# Patient Record
Sex: Male | Born: 1955 | Race: White | Hispanic: No | Marital: Married | Smoking: Never smoker
Health system: Southern US, Community
[De-identification: ages and names within clinical notes are randomized; demographics above are authoritative.]

## PROBLEM LIST (undated history)

## (undated) DIAGNOSIS — E785 Hyperlipidemia, unspecified: Secondary | ICD-10-CM

## (undated) DIAGNOSIS — I10 Essential (primary) hypertension: Secondary | ICD-10-CM

## (undated) DIAGNOSIS — M722 Plantar fascial fibromatosis: Secondary | ICD-10-CM

## (undated) HISTORY — PX: TONSILLECTOMY: SUR1361

## (undated) HISTORY — DX: Hyperlipidemia, unspecified: E78.5

## (undated) HISTORY — DX: Essential (primary) hypertension: I10

## (undated) HISTORY — PX: COLONOSCOPY: SHX174

## (undated) HISTORY — DX: Plantar fascial fibromatosis: M72.2

---

## 1976-02-23 DIAGNOSIS — M722 Plantar fascial fibromatosis: Secondary | ICD-10-CM

## 1976-02-23 HISTORY — DX: Plantar fascial fibromatosis: M72.2

## 1976-02-23 HISTORY — PX: HEEL SPUR SURGERY: SHX665

## 2008-11-12 ENCOUNTER — Encounter: Admission: RE | Admit: 2008-11-12 | Discharge: 2008-11-12 | Payer: Self-pay | Admitting: Orthopedic Surgery

## 2010-02-22 HISTORY — PX: SHOULDER SURGERY: SHX246

## 2011-04-28 ENCOUNTER — Other Ambulatory Visit (HOSPITAL_COMMUNITY)
Admission: RE | Admit: 2011-04-28 | Discharge: 2011-04-28 | Disposition: A | Discharge status: Home / Self Care | Payer: BC Managed Care – PPO | Source: Ambulatory Visit | Attending: Otolaryngology | Admitting: Otolaryngology

## 2011-04-28 DIAGNOSIS — R220 Localized swelling, mass and lump, head: Secondary | ICD-10-CM | POA: Insufficient documentation

## 2011-04-28 HISTORY — PX: US ECHOCARDIOGRAPHY: HXRAD669

## 2012-05-28 ENCOUNTER — Encounter: Payer: Self-pay | Admitting: *Deleted

## 2012-05-30 ENCOUNTER — Encounter: Payer: Self-pay | Admitting: Cardiovascular Disease

## 2012-11-21 ENCOUNTER — Other Ambulatory Visit: Payer: Self-pay | Admitting: Orthopedic Surgery

## 2012-12-14 ENCOUNTER — Encounter (HOSPITAL_BASED_OUTPATIENT_CLINIC_OR_DEPARTMENT_OTHER): Payer: Self-pay | Admitting: *Deleted

## 2012-12-14 ENCOUNTER — Encounter (HOSPITAL_BASED_OUTPATIENT_CLINIC_OR_DEPARTMENT_OTHER)
Admission: RE | Admit: 2012-12-14 | Discharge: 2012-12-14 | Disposition: A | Discharge status: Home / Self Care | Payer: BC Managed Care – PPO | Source: Ambulatory Visit | Attending: Orthopedic Surgery | Admitting: Orthopedic Surgery

## 2012-12-14 DIAGNOSIS — Z79899 Other long term (current) drug therapy: Secondary | ICD-10-CM | POA: Diagnosis not present

## 2012-12-14 DIAGNOSIS — Z01812 Encounter for preprocedural laboratory examination: Secondary | ICD-10-CM | POA: Diagnosis not present

## 2012-12-14 DIAGNOSIS — E785 Hyperlipidemia, unspecified: Secondary | ICD-10-CM | POA: Diagnosis not present

## 2012-12-14 DIAGNOSIS — I10 Essential (primary) hypertension: Secondary | ICD-10-CM | POA: Diagnosis not present

## 2012-12-14 DIAGNOSIS — M25819 Other specified joint disorders, unspecified shoulder: Secondary | ICD-10-CM | POA: Diagnosis present

## 2012-12-14 DIAGNOSIS — M24119 Other articular cartilage disorders, unspecified shoulder: Secondary | ICD-10-CM | POA: Diagnosis not present

## 2012-12-14 DIAGNOSIS — Z7982 Long term (current) use of aspirin: Secondary | ICD-10-CM | POA: Diagnosis not present

## 2012-12-14 DIAGNOSIS — M67919 Unspecified disorder of synovium and tendon, unspecified shoulder: Secondary | ICD-10-CM | POA: Diagnosis not present

## 2012-12-14 LAB — BASIC METABOLIC PANEL
GFR calc Af Amer: 90 mL/min — ABNORMAL LOW (ref >90)
GFR calc non Af Amer: 78 mL/min — ABNORMAL LOW (ref >90)
Glucose, Bld: 96 mg/dL (ref 70–99)
Potassium: 4.7 mEq/L (ref 3.5–5.1)
Sodium: 137 mEq/L (ref 135–145)

## 2012-12-14 NOTE — Progress Notes (Signed)
Will come in for bmet-very health-sees dr croitoru for htn-works out gym 3xweek-never smoked

## 2012-12-18 ENCOUNTER — Encounter (HOSPITAL_BASED_OUTPATIENT_CLINIC_OR_DEPARTMENT_OTHER)
Admission: RE | Disposition: A | Discharge status: Home / Self Care | Payer: Self-pay | Source: Ambulatory Visit | Attending: Orthopedic Surgery

## 2012-12-18 ENCOUNTER — Encounter (HOSPITAL_BASED_OUTPATIENT_CLINIC_OR_DEPARTMENT_OTHER): Payer: BC Managed Care – PPO | Admitting: Anesthesiology

## 2012-12-18 ENCOUNTER — Ambulatory Visit (HOSPITAL_BASED_OUTPATIENT_CLINIC_OR_DEPARTMENT_OTHER)
Admission: RE | Admit: 2012-12-18 | Discharge: 2012-12-18 | Disposition: A | Discharge status: Home / Self Care | Payer: BC Managed Care – PPO | Source: Ambulatory Visit | Attending: Orthopedic Surgery | Admitting: Orthopedic Surgery

## 2012-12-18 ENCOUNTER — Encounter (HOSPITAL_BASED_OUTPATIENT_CLINIC_OR_DEPARTMENT_OTHER): Payer: Self-pay

## 2012-12-18 ENCOUNTER — Ambulatory Visit (HOSPITAL_BASED_OUTPATIENT_CLINIC_OR_DEPARTMENT_OTHER): Payer: BC Managed Care – PPO | Admitting: Anesthesiology

## 2012-12-18 DIAGNOSIS — M67919 Unspecified disorder of synovium and tendon, unspecified shoulder: Secondary | ICD-10-CM | POA: Insufficient documentation

## 2012-12-18 DIAGNOSIS — Z79899 Other long term (current) drug therapy: Secondary | ICD-10-CM | POA: Insufficient documentation

## 2012-12-18 DIAGNOSIS — M25819 Other specified joint disorders, unspecified shoulder: Secondary | ICD-10-CM | POA: Insufficient documentation

## 2012-12-18 DIAGNOSIS — I10 Essential (primary) hypertension: Secondary | ICD-10-CM | POA: Insufficient documentation

## 2012-12-18 DIAGNOSIS — S43432D Superior glenoid labrum lesion of left shoulder, subsequent encounter: Secondary | ICD-10-CM

## 2012-12-18 DIAGNOSIS — E785 Hyperlipidemia, unspecified: Secondary | ICD-10-CM | POA: Insufficient documentation

## 2012-12-18 DIAGNOSIS — Z7982 Long term (current) use of aspirin: Secondary | ICD-10-CM | POA: Insufficient documentation

## 2012-12-18 DIAGNOSIS — M719 Bursopathy, unspecified: Secondary | ICD-10-CM | POA: Insufficient documentation

## 2012-12-18 DIAGNOSIS — M24119 Other articular cartilage disorders, unspecified shoulder: Secondary | ICD-10-CM | POA: Insufficient documentation

## 2012-12-18 DIAGNOSIS — Z01812 Encounter for preprocedural laboratory examination: Secondary | ICD-10-CM | POA: Insufficient documentation

## 2012-12-18 HISTORY — PX: SHOULDER ARTHROSCOPY WITH BICEPSTENOTOMY: SHX6204

## 2012-12-18 LAB — POCT HEMOGLOBIN-HEMACUE: Hemoglobin: 16.8 g/dL (ref 13.0–17.0)

## 2012-12-18 SURGERY — SHOULDER ARTHROSCOPY WITH BICEPS TENOTOMY
Anesthesia: General | Site: Shoulder | Laterality: Left | Wound class: Clean

## 2012-12-18 MED ORDER — OXYCODONE-ACETAMINOPHEN 5-325 MG PO TABS: 1.0000 | ORAL_TABLET | ORAL | Status: DC | PRN

## 2012-12-18 MED ORDER — PROPOFOL 10 MG/ML IV BOLUS
INTRAVENOUS | Status: DC | PRN
  Administered 2012-12-18: 200 mg via INTRAVENOUS

## 2012-12-18 MED ORDER — EPHEDRINE SULFATE 50 MG/ML IJ SOLN
INTRAMUSCULAR | Status: DC | PRN
  Administered 2012-12-18: 10 mg via INTRAVENOUS
  Administered 2012-12-18: 5 mg via INTRAVENOUS

## 2012-12-18 MED ORDER — LIDOCAINE HCL (CARDIAC) 20 MG/ML IV SOLN
INTRAVENOUS | Status: DC | PRN
  Administered 2012-12-18: 75 mg via INTRAVENOUS

## 2012-12-18 MED ORDER — FENTANYL CITRATE 0.05 MG/ML IJ SOLN
INTRAMUSCULAR | Status: AC
  Filled 2012-12-18: qty 2

## 2012-12-18 MED ORDER — DEXAMETHASONE SODIUM PHOSPHATE 4 MG/ML IJ SOLN
INTRAMUSCULAR | Status: DC | PRN
  Administered 2012-12-18: 4 mg

## 2012-12-18 MED ORDER — ONDANSETRON HCL 4 MG/2ML IJ SOLN: 4.0000 mg | Freq: Once | INTRAMUSCULAR | Status: DC | PRN

## 2012-12-18 MED ORDER — MIDAZOLAM HCL 2 MG/2ML IJ SOLN
1.0000 mg | INTRAMUSCULAR | Status: DC | PRN
  Administered 2012-12-18: 2 mg via INTRAVENOUS

## 2012-12-18 MED ORDER — SUCCINYLCHOLINE CHLORIDE 20 MG/ML IJ SOLN
INTRAMUSCULAR | Status: DC | PRN
  Administered 2012-12-18: 100 mg via INTRAVENOUS

## 2012-12-18 MED ORDER — ONDANSETRON HCL 4 MG/2ML IJ SOLN
INTRAMUSCULAR | Status: DC | PRN
  Administered 2012-12-18: 4 mg via INTRAVENOUS

## 2012-12-18 MED ORDER — BUPIVACAINE-EPINEPHRINE PF 0.5-1:200000 % IJ SOLN
INTRAMUSCULAR | Status: DC | PRN
  Administered 2012-12-18: 22 mL

## 2012-12-18 MED ORDER — FENTANYL CITRATE 0.05 MG/ML IJ SOLN
INTRAMUSCULAR | Status: AC
  Filled 2012-12-18: qty 4

## 2012-12-18 MED ORDER — CEFAZOLIN SODIUM-DEXTROSE 2-3 GM-% IV SOLR
INTRAVENOUS | Status: AC
  Filled 2012-12-18: qty 50

## 2012-12-18 MED ORDER — OXYCODONE HCL 5 MG PO TABS: 5.0000 mg | ORAL_TABLET | Freq: Once | ORAL | Status: DC | PRN

## 2012-12-18 MED ORDER — HYDROMORPHONE HCL PF 1 MG/ML IJ SOLN: 0.2500 mg | INTRAMUSCULAR | Status: DC | PRN

## 2012-12-18 MED ORDER — SODIUM CHLORIDE 0.9 % IR SOLN
Status: DC | PRN
  Administered 2012-12-18: 4500 mL

## 2012-12-18 MED ORDER — LACTATED RINGERS IV SOLN
INTRAVENOUS | Status: DC
  Administered 2012-12-18 (×2): via INTRAVENOUS

## 2012-12-18 MED ORDER — POVIDONE-IODINE 7.5 % EX SOLN: Freq: Once | CUTANEOUS | Status: DC

## 2012-12-18 MED ORDER — OXYCODONE HCL 5 MG/5ML PO SOLN: 5.0000 mg | Freq: Once | ORAL | Status: DC | PRN

## 2012-12-18 MED ORDER — MIDAZOLAM HCL 2 MG/2ML IJ SOLN
INTRAMUSCULAR | Status: AC
  Filled 2012-12-18: qty 2

## 2012-12-18 MED ORDER — DOCUSATE SODIUM 100 MG PO CAPS: 100.0000 mg | ORAL_CAPSULE | Freq: Three times a day (TID) | ORAL | Status: DC | PRN

## 2012-12-18 MED ORDER — CEFAZOLIN SODIUM-DEXTROSE 2-3 GM-% IV SOLR
2.0000 g | INTRAVENOUS | Status: AC
  Administered 2012-12-18: 2 g via INTRAVENOUS

## 2012-12-18 MED ORDER — FENTANYL CITRATE 0.05 MG/ML IJ SOLN
50.0000 ug | INTRAMUSCULAR | Status: DC | PRN
  Administered 2012-12-18: 100 ug via INTRAVENOUS

## 2012-12-18 SURGICAL SUPPLY — 84 items
BENZOIN TINCTURE PRP APPL 2/3 (GAUZE/BANDAGES/DRESSINGS) ×2 IMPLANT
BIT DRILL 3/32DIAX5INL DISPOSE (BIT) ×1 IMPLANT
BIT DRILL 3/32DX5IN DISP (BIT) ×1
BIT DRILL 7/64X5 DISP (BIT) ×2 IMPLANT
BLADE SURG 15 STRL LF DISP TIS (BLADE) ×1 IMPLANT
BLADE SURG 15 STRL SS (BLADE) ×1
BLADE SURG ROTATE 9660 (MISCELLANEOUS) IMPLANT
BLADE VORTEX 6.0 (BLADE) IMPLANT
BUR OVAL 4.0 (BURR) ×2 IMPLANT
CANISTER OMNI JUG 16 LITER (MISCELLANEOUS) ×2 IMPLANT
CANISTER SUCT 3000ML (MISCELLANEOUS) IMPLANT
CANNULA 5.75X71 LONG (CANNULA) ×2 IMPLANT
CANNULA TWIST IN 8.25X7CM (CANNULA) IMPLANT
CHLORAPREP W/TINT 26ML (MISCELLANEOUS) ×2 IMPLANT
DECANTER SPIKE VIAL GLASS SM (MISCELLANEOUS) IMPLANT
DERMABOND ADVANCED (GAUZE/BANDAGES/DRESSINGS) ×1
DERMABOND ADVANCED .7 DNX12 (GAUZE/BANDAGES/DRESSINGS) ×1 IMPLANT
DRAPE INCISE IOBAN 66X45 STRL (DRAPES) ×2 IMPLANT
DRAPE STERI 35X30 U-POUCH (DRAPES) ×2 IMPLANT
DRAPE SURG 17X23 STRL (DRAPES) ×2 IMPLANT
DRAPE U 20/CS (DRAPES) ×2 IMPLANT
DRAPE U-SHAPE 47X51 STRL (DRAPES) ×2 IMPLANT
DRAPE U-SHAPE 76X120 STRL (DRAPES) ×4 IMPLANT
DRILL BIT 3/32DIAX5INL DISPOSE (BIT) ×1
DRSG PAD ABDOMINAL 8X10 ST (GAUZE/BANDAGES/DRESSINGS) ×2 IMPLANT
ELECT BLADE 4.0 EZ CLEAN MEGAD (MISCELLANEOUS)
ELECT REM PT RETURN 9FT ADLT (ELECTROSURGICAL) ×2
ELECTRODE BLDE 4.0 EZ CLN MEGD (MISCELLANEOUS) IMPLANT
ELECTRODE REM PT RTRN 9FT ADLT (ELECTROSURGICAL) ×1 IMPLANT
GAUZE SPONGE 4X4 16PLY XRAY LF (GAUZE/BANDAGES/DRESSINGS) IMPLANT
GAUZE XEROFORM 1X8 LF (GAUZE/BANDAGES/DRESSINGS) IMPLANT
GLOVE BIO SURGEON STRL SZ7 (GLOVE) ×2 IMPLANT
GLOVE BIO SURGEON STRL SZ7.5 (GLOVE) ×2 IMPLANT
GLOVE BIOGEL PI IND STRL 7.0 (GLOVE) ×3 IMPLANT
GLOVE BIOGEL PI IND STRL 8 (GLOVE) ×1 IMPLANT
GLOVE BIOGEL PI INDICATOR 7.0 (GLOVE) ×3
GLOVE BIOGEL PI INDICATOR 8 (GLOVE) ×1
GLOVE ECLIPSE 6.5 STRL STRAW (GLOVE) ×2 IMPLANT
GOWN BRE IMP PREV XXLGXLNG (GOWN DISPOSABLE) ×2 IMPLANT
GOWN PREVENTION PLUS XLARGE (GOWN DISPOSABLE) ×4 IMPLANT
LASSO CRESCENT QUICKPASS (SUTURE) ×2 IMPLANT
NDL SUT 6 .5 CRC .975X.05 MAYO (NEEDLE) IMPLANT
NEEDLE 1/2 CIR CATGUT .05X1.09 (NEEDLE) IMPLANT
NEEDLE MAYO TAPER (NEEDLE)
NEEDLE SCORPION MULTI FIRE (NEEDLE) IMPLANT
NS IRRIG 1000ML POUR BTL (IV SOLUTION) IMPLANT
PACK ARTHROSCOPY DSU (CUSTOM PROCEDURE TRAY) ×2 IMPLANT
PACK BASIN DAY SURGERY FS (CUSTOM PROCEDURE TRAY) ×2 IMPLANT
PENCIL BUTTON HOLSTER BLD 10FT (ELECTRODE) ×2 IMPLANT
RESECTOR FULL RADIUS 4.2MM (BLADE) ×2 IMPLANT
SLEEVE SCD COMPRESS KNEE MED (MISCELLANEOUS) ×2 IMPLANT
SLING ARM FOAM STRAP LRG (SOFTGOODS) ×2 IMPLANT
SLING ARM FOAM STRAP MED (SOFTGOODS) IMPLANT
SLING ARM FOAM STRAP XLG (SOFTGOODS) IMPLANT
SLING ARM IMMOBILIZER MED (SOFTGOODS) IMPLANT
SPONGE GAUZE 4X4 12PLY (GAUZE/BANDAGES/DRESSINGS) ×2 IMPLANT
SPONGE LAP 4X18 X RAY DECT (DISPOSABLE) ×2 IMPLANT
STRIP CLOSURE SKIN 1/2X4 (GAUZE/BANDAGES/DRESSINGS) ×2 IMPLANT
SUCTION FRAZIER TIP 10 FR DISP (SUCTIONS) ×2 IMPLANT
SUPPORT WRAP ARM LG (MISCELLANEOUS) ×2 IMPLANT
SUT 2 FIBERLOOP 20 STRT BLUE (SUTURE) ×2
SUT BONE WAX W31G (SUTURE) IMPLANT
SUT ETHIBOND 2 OS 4 DA (SUTURE) IMPLANT
SUT ETHILON 3 0 PS 1 (SUTURE) ×2 IMPLANT
SUT ETHILON 4 0 PS 2 18 (SUTURE) IMPLANT
SUT FIBERWIRE #2 38 T-5 BLUE (SUTURE)
SUT MNCRL AB 3-0 PS2 18 (SUTURE) IMPLANT
SUT MNCRL AB 4-0 PS2 18 (SUTURE) IMPLANT
SUT PDS AB 0 CT 36 (SUTURE) IMPLANT
SUT PROLENE 3 0 PS 2 (SUTURE) IMPLANT
SUT VIC AB 0 CT1 27 (SUTURE) ×1
SUT VIC AB 0 CT1 27XBRD ANBCTR (SUTURE) ×1 IMPLANT
SUT VIC AB 2-0 SH 27 (SUTURE) ×1
SUT VIC AB 2-0 SH 27XBRD (SUTURE) ×1 IMPLANT
SUTURE 2 FIBERLOOP 20 STRT BLU (SUTURE) ×1 IMPLANT
SUTURE FIBERWR #2 38 T-5 BLUE (SUTURE) IMPLANT
SYR BULB 3OZ (MISCELLANEOUS) IMPLANT
TOWEL OR 17X24 6PK STRL BLUE (TOWEL DISPOSABLE) ×2 IMPLANT
TOWEL OR NON WOVEN STRL DISP B (DISPOSABLE) ×2 IMPLANT
TUBE CONNECTING 20X1/4 (TUBING) ×2 IMPLANT
TUBING ARTHROSCOPY IRRIG 16FT (MISCELLANEOUS) ×2 IMPLANT
WAND STAR VAC 90 (SURGICAL WAND) ×2 IMPLANT
WATER STERILE IRR 1000ML POUR (IV SOLUTION) ×2 IMPLANT
YANKAUER SUCT BULB TIP NO VENT (SUCTIONS) ×2 IMPLANT

## 2012-12-18 NOTE — Anesthesia Procedure Notes (Addendum)
Anesthesia Regional Block:  Interscalene brachial plexus block  Pre-Anesthetic Checklist: ,, timeout performed, Correct Patient, Correct Site, Correct Laterality, Correct Procedure, Correct Position, site marked, Risks and benefits discussed,  Surgical consent,  Pre-op evaluation,  At surgeon's request and post-op pain management  Laterality: Left and Upper  Prep: chloraprep       Needles:  Injection technique: Single-shot  Needle Type: Echogenic Needle     Needle Length: 5cm 5 cm Needle Gauge: 21 and 21 G    Additional Needles:  Procedures: ultrasound guided (picture in chart) Interscalene brachial plexus block Narrative:  Start time: 12/18/2012 10:29 AM End time: 12/18/2012 10:28 AM Injection made incrementally with aspirations every 5 mL.  Performed by: Personally  Anesthesiologist: Sheldon Silvan, MD  Interscalene brachial plexus block Procedure Name: Intubation Performed by: Lance Coon Pre-anesthesia Checklist: Patient identified, Emergency Drugs available, Suction available and Patient being monitored Patient Re-evaluated:Patient Re-evaluated prior to inductionOxygen Delivery Method: Circle System Utilized Preoxygenation: Pre-oxygenation with 100% oxygen Intubation Type: IV induction Ventilation: Mask ventilation without difficulty Laryngoscope Size: Mac and 3 Grade View: Grade II Tube type: Oral Tube size: 7.0 mm Number of attempts: 1 Airway Equipment and Method: stylet and oral airway Placement Confirmation: ETT inserted through vocal cords under direct vision,  positive ETCO2 and breath sounds checked- equal and bilateral Tube secured with: Tape Dental Injury: Teeth and Oropharynx as per pre-operative assessment

## 2012-12-18 NOTE — Anesthesia Preprocedure Evaluation (Signed)
Anesthesia Evaluation  Patient identified by MRN, date of birth, ID band Patient awake    Reviewed: Allergy & Precautions, H&P , NPO status , Patient's Chart, lab work & pertinent test results  Airway Mallampati: I TM Distance: >3 FB Neck ROM: Full    Dental  (+) Teeth Intact and Dental Advisory Given   Pulmonary  breath sounds clear to auscultation        Cardiovascular hypertension, Pt. on medications Rhythm:Regular Rate:Normal     Neuro/Psych    GI/Hepatic   Endo/Other    Renal/GU      Musculoskeletal   Abdominal   Peds  Hematology   Anesthesia Other Findings   Reproductive/Obstetrics                           Anesthesia Physical Anesthesia Plan  ASA: II  Anesthesia Plan: General   Post-op Pain Management:    Induction: Intravenous  Airway Management Planned: Oral ETT  Additional Equipment:   Intra-op Plan:   Post-operative Plan: Extubation in OR  Informed Consent: I have reviewed the patients History and Physical, chart, labs and discussed the procedure including the risks, benefits and alternatives for the proposed anesthesia with the patient or authorized representative who has indicated his/her understanding and acceptance.   Dental advisory given  Plan Discussed with: CRNA, Anesthesiologist and Surgeon  Anesthesia Plan Comments:         Anesthesia Quick Evaluation  

## 2012-12-18 NOTE — Progress Notes (Signed)
  Assisted Dr. Crews with left, ultrasound guided, supraclavicular block. Side rails up, monitors on throughout procedure. See vital signs in flow sheet. Tolerated Procedure well. 

## 2012-12-18 NOTE — Transfer of Care (Signed)
Immediate Anesthesia Transfer of Care Note  Patient: Rodney Thompson  Procedure(s) Performed: Procedure(s): SHOULDER ARTHROSCOPY, SUBACROMIAL DECOMPRESSION, OPEN BICEPS TENOTOMY (Left)  Patient Location: PACU  Anesthesia Type:GA combined with regional for post-op pain  Level of Consciousness: awake and alert   Airway & Oxygen Therapy: Patient Spontanous Breathing and Patient connected to face mask oxygen  Post-op Assessment: Report given to PACU RN and Post -op Vital signs reviewed and stable  Post vital signs: Reviewed and stable  Complications: No apparent anesthesia complications

## 2012-12-18 NOTE — Op Note (Signed)
Procedure(s): SHOULDER ARTHROSCOPY, SUBACROMIAL DECOMPRESSION, OPEN BICEPS TENOTOMY Procedure Note  Rodney Thompson male 57 y.o. 12/18/2012  Procedure(s) and Anesthesia Type:   #1 left shoulder arthroscopic debridement of type II superior labral tear with biceps tenotomy and debridement partial-thickness articular sided supraspinatus tear #2 left shoulder arthroscopic subacromial decompression #3 left shoulder open subpectoral biceps tenodesis  Surgeon(s) and Role:    * Mable Paris, MD - Primary     Surgeon: Mable Paris   Assistants: Damita Lack PA-C [facility] was present and scrubbed throughout the procedure and was essential in positioning, assisting with the camera and instrumentation,, and closure)  Anesthesia: General endotracheal anesthesia    Procedure Detail  SHOULDER ARTHROSCOPY, SUBACROMIAL DECOMPRESSION, OPEN BICEPS TENOTOMY  Estimated Blood Loss: Min         Drains: none  Blood Given: none         Specimens: none        Complications:  * No complications entered in OR log *         Disposition: PACU - hemodynamically stable.         Condition: stable    Procedure:   INDICATIONS FOR SURGERY: The patient is 57 y.o. male who has had a long history of left shoulder pain which has been refractory to nonoperative management with activity modification, exercise is an injection therapy. He mainly had anterior rotator cuff pain with biceps tendon pain.  OPERATIVE FINDINGS: Examination under anesthesia: No stiffness or instability Diagnostic Arthroscopy:  Glenoid articular cartilage: Intact Humeral head articular cartilage: Intact Labrum: Extensive partial tearing of the anterior labrum with complete detachment of the superior labrum from the supraglenoid tubercle. Complete detachment of the biceps anchor. The biceps tendon itself was intact. Loose bodies: None Synovitis: Minimal Articular sided rotator cuff: Partial-thickness  tearing involving less than 10% of the complete thickness of the supraspinatus tendon, debrided. Bursal sided rotator cuff: Intact Coracoacromial ligament: Mildly thickened and frayed indicating chronic impingement.  DESCRIPTION OF PROCEDURE: The patient was identified in preoperative  holding area where I personally marked the operative site after  verifying site, side, and procedure with the patient. An interscalene block was given by the attending anesthesiologist the holding area.  The patient was taken back to the operating room where general anesthesia was induced without complication and was placed in the beach-chair position with the back  elevated about 60 degrees and all extremities and head and neck carefully padded and  positioned.   The left upper extremity was then prepped and  draped in a standard sterile fashion. The appropriate time-out  procedure was carried out. The patient did receive IV antibiotics  within 30 minutes of incision.   A small posterior portal incision was made and the arthroscope was introduced into the joint. An anterior portal was then established above the subscapularis using needle localization. Small cannula was placed anteriorly. Diagnostic arthroscopy was then carried out with findings as described above.  The predominant finding was extensive partial tearing of the anterior labrum which was debrided back to healthy attached labrum. Superiorly the entire biceps anchor, superior labrum was detached from the supraglenoid tubercle. This area was debrided. Given his preoperative symptoms and finding of complete detachment of the biceps anchor a biceps tenotomy was performed. The stump was debrided. Attention was turned to the articular sided rotator cuff which was intact posteriorly. Superiorly he had extensive partial tearing involving the entire supraspinatus. This was debrided back to healthy bleeding tendon. There was not any significant exposed  footprint  of the greater tuberosity. Glenohumeral joint surfaces were intact.  The arthroscope was then introduced into the subacromial space a standard lateral portal was established with needle localization. The shaver was used through the lateral portal to perform extensive bursectomy. Coracoacromial ligament was examined and found to be slightly thickened and frayed.  The bursal surface of the rotator cuff was carefully examined posterior and anterior and found to have no partial or full-thickness tearing  The coracoacromial ligament was taken down off the anterior acromion with the ArthroCare exposing a moderate anterior acromial spur. A high-speed bur was then used through the lateral portal to take down the anterior acromial spur from lateral to medial in a standard acromioplasty.  The acromioplasty was also viewed from the lateral portal and the bur was used as necessary to ensure that the acromion was completely flat from posterior to anterior.   Attention was then turned to the axilla where a approximately 3 cm incision was made in the dominant axillary fold. This was about 50% above and 50% below the palpable lower border of the pectoralis major. Dissection was carried out between the lower border of the pectoralis major and the short head of the biceps muscle belly. The anterior humerus was then exposed and the long head biceps was delivered out through the wound. The biceps was prepared using a #2 FiberWire fiber loop and the remaining portion of the biceps tendon was discarded after choosing the appropriate tension and length. A drill bit slightly smaller than the tendon was used in the distal bicipital groove to create an intramedullary hole and then a drill bit about 12 mm distal to that was used which was slightly larger than the suture passer needle. A crescent suture lasso was then used to pass the sutures from proximal to distal and then one suture was brought around medial and lateral to the  tendon. It was tensioned, dunking the tendon into the intramedullary canal and tied over the anterior portion of the tendon. The tension was felt to be appropriate. The wound was copiously irrigated with normal saline and subsequently closed in layers with 2-0 Vicryl in the deep dermal layer and Dermabond for skin closure.  The arthroscopic equipment was removed from the joint and the portals were closed with 3-0 nylon in an interrupted fashion. Sterile dressings were then applied including Xeroform 4 x 4's ABDs and tape. The patient was then allowed to awaken from general anesthesia, placed in a sling, transferred to the stretcher and taken to the recovery room in stable condition.   POSTOPERATIVE PLAN: The patient will be discharged home today and will followup in one week for suture removal and wound check.

## 2012-12-18 NOTE — Anesthesia Postprocedure Evaluation (Signed)
  Anesthesia Post-op Note  Patient: Rodney Thompson  Procedure(s) Performed: Procedure(s): SHOULDER ARTHROSCOPY, SUBACROMIAL DECOMPRESSION, OPEN BICEPS TENOTOMY (Left)  Patient Location: PACU  Anesthesia Type:GA combined with regional for post-op pain  Level of Consciousness: awake, alert  and oriented  Airway and Oxygen Therapy: Patient Spontanous Breathing  Post-op Pain: none  Post-op Assessment: Post-op Vital signs reviewed  Post-op Vital Signs: Reviewed  Complications: No apparent anesthesia complications

## 2012-12-18 NOTE — H&P (Signed)
Rodney Thompson is an 57 y.o. male.   Chief Complaint: L shoulder pain HPI: L shoulder chronic biceps tendinopathy, impingement.  Past Medical History  Diagnosis Date  . Hypertension   . Hyperlipemia     Past Surgical History  Procedure Laterality Date  . Shoulder surgery  2012    right  . Heel spur surgery  1978/1979/1989    right and lt-plantar fac.  [name] US echocardiography  04/28/11    trace MR and TR; EF =>55%  . Tonsillectomy    . Colonoscopy      Family History  Problem Relation Age of Onset  . Cancer Father   . Hypertension Sister   . Hypertension Sister    Social History:  reports that he has never smoked. He does not have any smokeless tobacco history on file. He reports that he drinks about 7.0 ounces of alcohol per week. His drug history is not on file.  Allergies:  Allergies  Allergen Reactions  . Simcor [Niacin-Simvastatin Er]     flushing  . Vicodin [Hydrocodone-Acetaminophen] Itching    Medications Prior to Admission  Medication Sig Dispense Refill  . aspirin 81 MG tablet Take 81 mg by mouth daily.      . fish oil-omega-3 fatty acids 1000 MG capsule Take 1 g by mouth daily.      [name] lisinopril (PRINIVIL,ZESTRIL) 5 MG tablet Take 5 mg by mouth every evening.       . simvastatin (ZOCOR) 40 MG tablet Take 40 mg by mouth every morning.         Results for orders placed during the hospital encounter of 12/18/12 (from the past 48 hour(s))  POCT HEMOGLOBIN-HEMACUE     Status: None   Collection Time    12/18/12 10:10 AM      Result Value Range   Hemoglobin 16.8  13.0 - 17.0 g/dL   No results found.  Review of Systems  All other systems reviewed and are negative.    Blood pressure 115/74, pulse 65, temperature 98 F (36.7 C), temperature source Oral, resp. rate 10, height 5\' 8"  (1.727 m), weight 74.844 kg (165 lb), SpO2 100.00%. Physical Exam  Constitutional: He is oriented to person, place, and time. He appears well-developed and well-nourished.  HENT:   Head: Atraumatic.  Eyes: EOM are normal.  Cardiovascular: Intact distal pulses.   Respiratory: Effort normal.  Musculoskeletal:  L shoulder TTP over biceps groove, pain with biceps and impingement testing.  Neurological: He is alert and oriented to person, place, and time.  Skin: Skin is warm and dry.  Psychiatric: He has a normal mood and affect.     Assessment/Plan Plan arthr tenotomy, open tenodesis, possible SAD Risks / benefits of surgery discussed Consent on chart  NPO for OR Preop antibiotics   [name] 12/18/2012, 11:26 AM

## 2012-12-20 ENCOUNTER — Encounter (HOSPITAL_BASED_OUTPATIENT_CLINIC_OR_DEPARTMENT_OTHER): Payer: Self-pay | Admitting: Orthopedic Surgery

## 2013-04-26 ENCOUNTER — Ambulatory Visit (INDEPENDENT_AMBULATORY_CARE_PROVIDER_SITE_OTHER): Payer: BC Managed Care – PPO | Admitting: Cardiovascular Disease

## 2013-04-26 ENCOUNTER — Encounter: Payer: Self-pay | Admitting: Cardiovascular Disease

## 2013-04-26 VITALS — BP 122/82 | HR 63 | Resp 16 | Ht 68.0 in | Wt 176.4 lb

## 2013-04-26 DIAGNOSIS — I10 Essential (primary) hypertension: Secondary | ICD-10-CM

## 2013-04-26 DIAGNOSIS — E782 Mixed hyperlipidemia: Secondary | ICD-10-CM

## 2013-04-26 DIAGNOSIS — I951 Orthostatic hypotension: Secondary | ICD-10-CM | POA: Insufficient documentation

## 2013-04-26 DIAGNOSIS — E785 Hyperlipidemia, unspecified: Secondary | ICD-10-CM

## 2013-04-26 NOTE — Assessment & Plan Note (Signed)
Excellent control. Continue lisinopril.

## 2013-04-26 NOTE — Progress Notes (Signed)
Patient ID: Rodney Thompson, male   DOB: [birth year], 57 y.o.   MRN: [id number]     Reason for office visit Hypertension, hyperlipidemia  Mr. Vanecek is feeling well. His only health problem since his last appointment was shoulder surgery. He tried to wean off his lisinopril as we had discussed at his last appointment, but his blood pressure became high. He resumed the medication and a list of home blood pressure recordings shows excellent control in the 120-130 over low 80s range. Labs performed in October with his primary care physician showed that his cholesterol remains well controlled, although he has residual hypertriglyceridemia. His PSA was slightly elevated and his prostate biopsy was benign. He has gained back to 3 pounds that he had lost his last appointment and has made a decision to increase exercise to 5 days a week and to limit his intake of carbohydrates (bread is his biggest weakness).   Allergies  Allergen Reactions  . Simcor [Niacin-Simvastatin Er]     flushing  . Vicodin [Hydrocodone-Acetaminophen] Itching    Current Outpatient Prescriptions  Medication Sig Dispense Refill  . aspirin 81 MG tablet Take 81 mg by mouth daily.      . fish oil-omega-3 fatty acids 1000 MG capsule Take 1 g by mouth daily.      [name] lisinopril (PRINIVIL,ZESTRIL) 5 MG tablet Take 5 mg by mouth every evening.       . simvastatin (ZOCOR) 40 MG tablet Take 40 mg by mouth every morning.        No current facility-administered medications for this visit.    Past Medical History  Diagnosis Date  . Hypertension   . Hyperlipemia     Past Surgical History  Procedure Laterality Date  . Shoulder surgery  2012    right  . Heel spur surgery  1978/1979/1989    right and lt-plantar fac.  [name] US echocardiography  04/28/11    trace MR and TR; EF =>55%  . Tonsillectomy    . Colonoscopy    . Shoulder arthroscopy with bicepstenotomy Left 12/18/2012    Procedure: SHOULDER ARTHROSCOPY, SUBACROMIAL DECOMPRESSION,  OPEN BICEPS TENOTOMY;  Surgeon: Mable Paris, MD;  Location: [facility];  Service: Orthopedics;  Laterality: Left;    Family History  Problem Relation Age of Onset  . Cancer Father   . Hypertension Sister   . Hypertension Sister     History   Social History  . Marital Status: Married    Spouse Name: N/A    Number of Children: N/A  . Years of Education: N/A   Occupational History  . Not on file.   Social History Main Topics  . Smoking status: Never Smoker   . Smokeless tobacco: Not on file  . Alcohol Use: 7.0 oz/week    14 drink(s) per week  . Drug Use: Not on file  . Sexual Activity: Not on file   Other Topics Concern  . Not on file   Social History Narrative  . No narrative on file    Review of systems: The patient specifically denies any chest pain at rest or with exertion, dyspnea at rest or with exertion, orthopnea, paroxysmal nocturnal dyspnea, syncope, palpitations, focal neurological deficits, intermittent claudication, lower extremity edema, unexplained weight gain, cough, hemoptysis or wheezing.  The patient also denies abdominal pain, nausea, vomiting, dysphagia, diarrhea, constipation, polyuria, polydipsia, dysuria, hematuria, frequency, urgency, abnormal bleeding or bruising, fever, chills, unexpected weight changes, mood swings, change in skin or hair texture, change  in voice quality, auditory or visual problems, allergic reactions or rashes, new musculoskeletal complaints other than usual "aches and pains".   PHYSICAL EXAM BP 122/82  Pulse 63  Resp 16  Ht 5\' 8"  (1.727 m)  Wt 80.015 kg (176 lb 6.4 oz)  BMI 26.83 kg/m2  General: Alert, oriented x3, no distress Head: no evidence of trauma, PERRL, EOMI, no exophtalmos or lid lag, no myxedema, no xanthelasma; normal ears, nose and oropharynx Neck: normal jugular venous pulsations and no hepatojugular reflux; brisk carotid pulses without delay and no carotid bruits Chest: clear to  auscultation, no signs of consolidation by percussion or palpation, normal fremitus, symmetrical and full respiratory excursions Cardiovascular: normal position and quality of the apical impulse, regular rhythm, normal first and second heart sounds, no murmurs, rubs or gallops Abdomen: no tenderness or distention, no masses by palpation, no abnormal pulsatility or arterial bruits, normal bowel sounds, no hepatosplenomegaly Extremities: no clubbing, cyanosis or edema; 2+ radial, ulnar and brachial pulses bilaterally; 2+ right femoral, posterior tibial and dorsalis pedis pulses; 2+ left femoral, posterior tibial and dorsalis pedis pulses; no subclavian or femoral bruits Neurological: grossly nonfocal   EKG: NSR  Lipid Panel  No results found for this basename: chol, trig, hdl, cholhdl, vldl, ldlcalc    BMET    Component Value Date/Time   NA 137 12/14/2012 1200   K 4.7 12/14/2012 1200   CL 101 12/14/2012 1200   CO2 27 12/14/2012 1200   GLUCOSE 96 12/14/2012 1200   BUN 10 12/14/2012 1200   CREATININE 1.05 12/14/2012 1200   CALCIUM 9.2 12/14/2012 1200   GFRNONAA 78* 12/14/2012 1200   GFRAA 90* 12/14/2012 1200     ASSESSMENT AND PLAN HTN (hypertension) Excellent control. Continue lisinopril.  Mixed hyperlipidemia Cholesterol parameters are in the favorable range. Mild residual hypertriglyceridemia should respond to carbohydrate restriction and increased physical exercise. He is in the moderately overweight range. Target weight should be around 165-167 pounds or so.   Orders Placed This Encounter  Procedures  . EKG 12-Lead   No orders of the defined types were placed in this encounter.    Junious SilkROITORU,Laurette Villescas  Veretta Sabourin, MD, [facility] CenterFACC [facility] [phone] office [phone] pager

## 2013-04-26 NOTE — Assessment & Plan Note (Signed)
Cholesterol parameters are in the favorable range. Mild residual hypertriglyceridemia should respond to carbohydrate restriction and increased physical exercise. He is in the moderately overweight range. Target weight should be around 165-167 pounds or so.

## 2013-04-26 NOTE — Patient Instructions (Signed)
Your physician recommends that you schedule a follow-up appointment in: ONE YEAR with Dr.Croitoru  

## 2014-08-06 ENCOUNTER — Encounter: Payer: Self-pay | Admitting: Cardiovascular Disease

## 2014-11-06 ENCOUNTER — Encounter: Payer: Self-pay | Admitting: Internal Medicine

## 2016-04-05 ENCOUNTER — Ambulatory Visit: Payer: PRIVATE HEALTH INSURANCE | Admitting: Cardiovascular Disease

## 2016-04-08 ENCOUNTER — Encounter: Payer: Self-pay | Admitting: Physician Assistant

## 2016-04-08 ENCOUNTER — Ambulatory Visit (INDEPENDENT_AMBULATORY_CARE_PROVIDER_SITE_OTHER): Payer: PRIVATE HEALTH INSURANCE | Admitting: Physician Assistant

## 2016-04-08 VITALS — BP 124/81 | HR 61 | Ht 68.0 in | Wt 173.2 lb

## 2016-04-08 DIAGNOSIS — E7801 Familial hypercholesterolemia: Secondary | ICD-10-CM

## 2016-04-08 DIAGNOSIS — I10 Essential (primary) hypertension: Secondary | ICD-10-CM | POA: Diagnosis not present

## 2016-04-08 NOTE — Progress Notes (Signed)
Cardiology Office Note   Date:  04/08/2016   ID:  Rodney Thompson, DOB 11/08/1955, MRN [id number]  PCP:  Gwynneth Alimentobyn N Sanders, MD  Cardiologist:  Dr Royann Shiversroitoru 04/26/2013  Theodore DemarkBarrett, Rhonda, PA-C   Chief Complaint  Patient presents with  . Follow-up    Dr. Salena Saner, hasnt seen in 3 years just a check up    History of Present Illness: Rodney Kitchenaul Boivin is a 61 y.o. male with a history of HTN, HLD  Rodney Kitchenaul [place] presents for cardiology follow up and evaluation  He is very active, going to the gym 4 days/week. He plays golf twice a week also. He does not get chest pain with exertion. He does not have significant dyspnea on exertion. He feels that he can push himself when he is at the gym and not get any symptoms.  He gets his lipids done regularly by Dr. Allyne GeeSanders. He remembers that his cholesterol was up to 201 on the last check. He will get his those records.  He has not had lower extremity edema, orthopnea, or PND. He has not had any palpitations, presyncope or other symptoms. The cardiac standpoint, he is doing very well.   Past Medical History:  Diagnosis Date  . Hyperlipemia   . Hypertension   . Plantar fasciitis, bilateral 1978    Past Surgical History:  Procedure Laterality Date  . COLONOSCOPY    . HEEL SPUR SURGERY Bilateral 1978   Also 1979 and 1989.  Plantar fasciitis  . SHOULDER ARTHROSCOPY WITH BICEPSTENOTOMY Left 12/18/2012   Procedure: SHOULDER ARTHROSCOPY, SUBACROMIAL DECOMPRESSION, OPEN BICEPS TENOTOMY;  Surgeon: Mable ParisJustin William Chandler, MD;  Location: [facility];  Service: Orthopedics;  Laterality: Left;  . SHOULDER SURGERY  2012   right  . TONSILLECTOMY    . US ECHOCARDIOGRAPHY  04/28/11   trace MR and TR; EF =>55%    Current Outpatient Prescriptions  Medication Sig Dispense Refill  . aspirin 81 MG tablet Take 81 mg by mouth daily.    . fish oil-omega-3 fatty acids 1000 MG capsule Take 1 g by mouth daily.    Rodney Kitchen. lisinopril (PRINIVIL,ZESTRIL) 5 MG tablet Take 5  mg by mouth every evening.     . simvastatin (ZOCOR) 40 MG tablet Take 40 mg by mouth every morning.      No current facility-administered medications for this visit.     Allergies:   Simcor [niacin-simvastatin er] and Vicodin [hydrocodone-acetaminophen]    Social History:  The patient  reports that he has never smoked. He has never used smokeless tobacco. He reports that he drinks about 7.0 oz of alcohol per week .   Family History:  The patient's family history includes CVA in his paternal grandfather; Cancer (age of onset: 6378) in his father; Hypertension in his sister and sister; Hypertension (age of onset: 4088) in his mother.    ROS:  Please see the history of present illness. All other systems are reviewed and negative.    PHYSICAL EXAM: VS:  BP 124/81 (BP Location: Right Arm, Patient Position: Sitting, Cuff Size: Normal)   Pulse 61   Ht 5\' 8"  (1.727 m)   Wt 173 lb 3.2 oz (78.6 kg)   BMI 26.33 kg/m  , BMI Body mass index is 26.33 kg/m. GEN: Well nourished, well developed, male in no acute distress  HEENT: normal for age  Neck: no JVD, no carotid bruit, no masses Cardiac: RRR; no murmur, no rubs, or gallops Respiratory:  clear to auscultation bilaterally, normal  work of breathing GI: soft, nontender, nondistended, + BS MS: no deformity or atrophy; no edema; distal pulses are 2+ in all 4 extremities   Skin: warm and dry, no rash Neuro:  Strength and sensation are intact Psych: euthymic mood, full affect   EKG:  EKG is ordered today. ECG ordered today shows sinus rhythm, heart rate 61, no acute ischemic changes, no LVH, no Q waves  Recent Labs: No results found for requested labs within last 8760 hours.    Lipid Panel No results found for: CHOL, TRIG, HDL, CHOLHDL, VLDL, LDLCALC, LDLDIRECT   Wt Readings from Last 3 Encounters:  04/08/16 173 lb 3.2 oz (78.6 kg)  04/26/13 176 lb 6.4 oz (80 kg)  12/18/12 165 lb (74.8 kg)     Other studies Reviewed: Additional  studies/ records that were reviewed today include: Office notes and testing.  ASSESSMENT AND PLAN:  1.  Familial hyperlipidemia: He has required medication to keep his LDL down close to 100. It is not quite at target. It may have gone up some. He feels that he eats more carbohydrates than he should, but denies excess cholesterol in his diet. If his LDL is not at goal, we will change his Zocor to Lipitor 40 mg daily. Follow labs per Dr. Allyne Gee.  2. Hypertension: He tried to come off the lisinopril in the past, but his blood pressure went up and he restarted it. Blood pressures under good control today.  3. Cardiac restrictor reduction: He has a genetic predisposition to hypertension, and there is stroke in second-degree relatives, but no family history of premature CAD. He is encouraged to continue heart healthy lifestyle including exercise and healthy eating.   Current medicines are reviewed at length with the patient today.  The patient does not have concerns regarding medicines.  The following changes have been made:  Possible change to Lipitor once labs are reviewed  Labs/ tests ordered today include:   No orders of the defined types were placed in this encounter.    Disposition:   FU with Dr. Royann Shivers in a year  Signed, Leanna Battles  04/08/2016 8:22 AM    [facility] Phone: [phone]; Fax: [phone]  This note was written with the assistance of speech recognition software. Please excuse any transcriptional errors.

## 2016-04-08 NOTE — Patient Instructions (Signed)
Please send cholesterol results to our office -- via MyChart message -- via fax: [phone] -- or drop off or mail copy  Your physician recommends that you continue on your current medications as directed. Please refer to the Current Medication list given to you today.  Your physician wants you to follow-up in: ONE YEAR with Dr. Royann Shiversroitoru. You will receive a reminder letter in the mail two months in advance. If you don't receive a letter, please call our office to schedule the follow-up appointment.

## 2017-02-22 HISTORY — PX: OTHER SURGICAL HISTORY: SHX169

## 2017-03-10 ENCOUNTER — Ambulatory Visit: Payer: PRIVATE HEALTH INSURANCE | Admitting: Cardiology

## 2017-03-28 ENCOUNTER — Ambulatory Visit (INDEPENDENT_AMBULATORY_CARE_PROVIDER_SITE_OTHER): Payer: PRIVATE HEALTH INSURANCE | Admitting: Cardiology

## 2017-03-28 ENCOUNTER — Encounter: Payer: Self-pay | Admitting: Cardiology

## 2017-03-28 VITALS — BP 120/82 | HR 81 | Ht 68.0 in | Wt 167.8 lb

## 2017-03-28 DIAGNOSIS — I10 Essential (primary) hypertension: Secondary | ICD-10-CM

## 2017-03-28 DIAGNOSIS — E782 Mixed hyperlipidemia: Secondary | ICD-10-CM

## 2017-03-28 NOTE — Patient Instructions (Signed)
Your physician wants you to follow-up in: ONE YEAR with Dr.Croitoru.  You will receive a reminder letter in the mail two months in advance. If you don't receive a letter, please call our office to schedule the follow-up appointment.  

## 2017-03-28 NOTE — Progress Notes (Addendum)
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 He has HTN and dyslipidemia. He is active, goes to the gym at lest 3 days a week and plays golf. He denies chest pain or unusual dyspnea. He does not smoke and has no family history of early CAD.    Current Outpatient Medications  Medication Sig Dispense Refill  . fish oil-omega-3 fatty acids 1000 MG capsule Take 1 g by mouth daily.    Rodney Thompson. lisinopril (PRINIVIL,ZESTRIL) 5 MG tablet Take 5 mg by mouth every evening.     . simvastatin (ZOCOR) 40 MG tablet Take 40 mg by mouth every morning.      No current facility-administered medications for this visit.     Allergies  Allergen Reactions  . Simcor [Niacin-Simvastatin Er] Other (See Comments)    flushing  . Vicodin [Hydrocodone-Acetaminophen] Itching and Rash    Past Medical History:  Diagnosis Date  . Hyperlipemia   . Hypertension   . Plantar fasciitis, bilateral 1978    Social History   Socioeconomic History  . Marital status: Married    Spouse name: Not on file  . Number of children: Not on file  . Years of education: Not on file  . Highest education level: Not on file  Social Needs  . Financial resource strain: Not on file  . Food insecurity - worry: Not on file  . Food insecurity - inability: Not on file  . Transportation needs - medical: Not on file  . Transportation needs - non-medical: Not on file  Occupational History  . Not on file  Tobacco Use  . Smoking status: Never Smoker  . Smokeless tobacco: Never Used  Substance and Sexual Activity  . Alcohol use: Yes    Alcohol/week: 7.0 oz    Types: 14 Standard drinks or equivalent per week  . Drug use: Not on file  . Sexual activity: Not on file  Other Topics Concern  . Not on file  Social History Narrative  . Not on file     Family History  Problem Relation  Age of Onset  . Hypertension Mother 9888  . Cancer Father 9078  . CVA Paternal Grandfather   . Hypertension Sister   . Hypertension Sister      Review of Systems: General: negative for chills, fever, night sweats or weight changes.  Cardiovascular: negative for chest pain, dyspnea on exertion, edema, orthopnea, palpitations, paroxysmal nocturnal dyspnea or shortness of breath Dermatological: negative for rash Respiratory: negative for cough or wheezing Urologic: negative for hematuria Abdominal: negative for nausea, vomiting, diarrhea, bright red blood per rectum, melena, or hematemesis Neurologic: negative for visual changes, syncope, or dizziness All other systems reviewed and are otherwise negative except as noted above.    Blood pressure 120/82, pulse 81, height 5\' 8"  (1.727 m), weight 167 lb 12.8 oz (76.1 kg).  General appearance: alert, cooperative and no distress Neck: no carotid bruit and no JVD Lungs: clear to auscultation bilaterally Heart: regular rate and rhythm Extremities: extremities normal, atraumatic, no cyanosis or edema Skin: Skin color, texture, turgor normal. No rashes or lesions Neurologic: Grossly normal  EKG NSR, LAD  ASSESSMENT AND PLAN:   HTN (hypertension) Controlled  Mixed hyperlipidemia Followed by PCP, he is on Zocor 40 mg   PLAN  No change, f/u in one year.   Corine ShelterLuke Mykenzie Ebanks PA-C  03/28/2017 3:28 PM   Addendum: Labs reviewed and will be scanned into his chart. His LDL was a little high- 113, otherwise CBC, CMET and TSH WNL. No change in Rx.  Corine Shelter PA-C 03/28/2017 3:32 PM

## 2017-03-28 NOTE — Assessment & Plan Note (Signed)
Controlled.  

## 2017-03-28 NOTE — Assessment & Plan Note (Signed)
Followed by PCP, he is on Zocor 40 mg

## 2018-04-14 ENCOUNTER — Ambulatory Visit: Payer: PRIVATE HEALTH INSURANCE | Admitting: Cardiovascular Disease

## 2018-04-19 ENCOUNTER — Ambulatory Visit (INDEPENDENT_AMBULATORY_CARE_PROVIDER_SITE_OTHER): Payer: PRIVATE HEALTH INSURANCE | Admitting: Cardiology

## 2018-04-19 ENCOUNTER — Encounter: Payer: Self-pay | Admitting: Cardiology

## 2018-04-19 ENCOUNTER — Encounter (INDEPENDENT_AMBULATORY_CARE_PROVIDER_SITE_OTHER): Payer: Self-pay

## 2018-04-19 VITALS — BP 110/66 | HR 77 | Ht 67.75 in | Wt 166.0 lb

## 2018-04-19 DIAGNOSIS — I10 Essential (primary) hypertension: Secondary | ICD-10-CM

## 2018-04-19 DIAGNOSIS — E782 Mixed hyperlipidemia: Secondary | ICD-10-CM | POA: Diagnosis not present

## 2018-04-19 DIAGNOSIS — I951 Orthostatic hypotension: Secondary | ICD-10-CM

## 2018-04-19 NOTE — Assessment & Plan Note (Signed)
Stop Lisinopril.

## 2018-04-19 NOTE — Progress Notes (Signed)
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 He has HTN and dyslipidemia. He is active, goes to the gym at lest 3 days a week and plays golf and carries his own bag. He denies chest pain or unusual dyspnea. He does not smoke and has no family history of early CAD. He was actually going to cancel this follow up but he has noticed positional dizziness the last few weeks.  His symptoms sound orthostatic.  He denies tachycardia or palpitations.  He has not had syncope.    Current Outpatient Medications  Medication Sig Dispense Refill  . fish oil-omega-3 fatty acids 1000 MG capsule Take 1 g by mouth daily.    [name] lisinopril (PRINIVIL,ZESTRIL) 5 MG tablet Take 5 mg by mouth every evening.     . simvastatin (ZOCOR) 40 MG tablet Take 40 mg by mouth every morning.      No current facility-administered medications for this visit.     Allergies  Allergen Reactions  . Simcor [Niacin-Simvastatin Er] Other (See Comments)    flushing  . Vicodin [Hydrocodone-Acetaminophen] Itching and Rash    Past Medical History:  Diagnosis Date  . Hyperlipemia   . Hypertension   . Plantar fasciitis, bilateral 1978    Social History   Socioeconomic History  . Marital status: Married    Spouse name: Not on file  . Number of children: Not on file  . Years of education: Not on file  . Highest education level: Not on file  Occupational History  . Not on file  Social Needs  . Financial resource strain: Not on file  . Food insecurity:    Worry: Not on file    Inability: Not on file  . Transportation needs:    Medical: Not on file    Non-medical: Not on file  Tobacco Use  . Smoking status: Never Smoker  . Smokeless tobacco: Never Used  Substance and Sexual Activity  . Alcohol use: Yes    Alcohol/week: 14.0 standard drinks    Types: 14 Standard  drinks or equivalent per week  . Drug use: Not on file  . Sexual activity: Not on file  Lifestyle  . Physical activity:    Days per week: Not on file    Minutes per session: Not on file  . Stress: Not on file  Relationships  . Social connections:    Talks on phone: Not on file    Gets together: Not on file    Attends religious service: Not on file    Active member of club or organization: Not on file    Attends meetings of clubs or organizations: Not on file    Relationship status: Not on file  . Intimate partner violence:    Fear of current or ex partner: Not on file    Emotionally abused: Not on file    Physically abused: Not on file    Forced sexual activity: Not on file  Other Topics Concern  . Not on file  Social History Narrative  . Not on file     Family History  Problem Relation Age of Onset  . Hypertension Mother 31  . Cancer Father 33  . CVA Paternal Grandfather   . Hypertension Sister   . Hypertension Sister      Review of Systems: General: negative for chills, fever,  night sweats or weight changes.  Cardiovascular: negative for chest pain, dyspnea on exertion, edema, orthopnea, palpitations, paroxysmal nocturnal dyspnea or shortness of breath Dermatological: negative for rash Respiratory: negative for cough or wheezing Urologic: negative for hematuria Abdominal: negative for nausea, vomiting, diarrhea, bright red blood per rectum, melena, or hematemesis Neurologic: negative for visual changes, syncope All other systems reviewed and are otherwise negative except as noted above.    Blood pressure 110/66, pulse 77, height 5' 7.75" (1.721 m), weight 166 lb (75.3 kg).  General appearance: alert, cooperative and no distress Neck: no carotid bruit and no JVD Lungs: clear to auscultation bilaterally Heart: regular rate and rhythm Extremities: no edema Skin: warm and dry Neurologic: Grossly normal  EKG NSR- LAD- QTc 414. HR 77  ASSESSMENT AND PLAN:    Orthostatic hypotension B/P by me laying flat- 108/68, sitting up- 84 systolic with dizziness Stop Lisinopril- hydrate  Mixed hyperlipidemia Followed by PCP, he is on Zocor 40 mg   PLAN  Dr Royann Shivers one year.  Corine Shelter PA-C 04/19/2018 2:38 PM

## 2018-04-19 NOTE — Progress Notes (Signed)
Thanks.  Is he going to recheck his blood pressure at home and let us know how its going? MCr

## 2018-04-19 NOTE — Patient Instructions (Signed)
Medication Instructions:  STOP Lisinopril  If you need a refill on your cardiac medications before your next appointment, please call your pharmacy.   Lab work: None  If you have labs (blood work) drawn today and your tests are completely normal, you will receive your results only by: [name] MyChart Message (if you have MyChart) OR . A paper copy in the mail If you have any lab test that is abnormal or we need to change your treatment, we will call you to review the results.  Testing/Procedures: None   Follow-Up: At [facility], you and your health needs are our priority.  As part of our continuing mission to provide you with exceptional heart care, we have created designated Provider Care Teams.  These Care Teams include your primary Cardiologist (physician) and Advanced Practice Providers (APPs -  Physician Assistants and Nurse Practitioners) who all work together to provide you with the care you need, when you need it. You will need a follow up appointment in 12 months.  Please call our office 2 months in advance to schedule this appointment.  You may see Thurmon Fair, MD or one of the following Advanced Practice Providers on your designated Care Team: [place], [place] . Micah Flesher, PA-C  Any Other Special Instructions Will Be Listed Below (If Applicable).

## 2018-04-27 ENCOUNTER — Other Ambulatory Visit: Payer: Self-pay | Admitting: Internal Medicine

## 2018-04-27 DIAGNOSIS — Z0000 Encounter for general adult medical examination without abnormal findings: Secondary | ICD-10-CM

## 2018-04-28 ENCOUNTER — Other Ambulatory Visit: Payer: PRIVATE HEALTH INSURANCE

## 2018-04-28 ENCOUNTER — Other Ambulatory Visit: Payer: Self-pay

## 2018-04-28 DIAGNOSIS — Z0000 Encounter for general adult medical examination without abnormal findings: Secondary | ICD-10-CM

## 2018-04-28 MED ORDER — LISINOPRIL 2.5 MG PO TABS: 2.5000 mg | ORAL_TABLET | Freq: Every day | ORAL | 11 refills | Status: DC

## 2018-04-29 LAB — CMP14+EGFR
ALK PHOS: 79 IU/L (ref 39–117)
ALT: 28 IU/L (ref 0–44)
AST: 20 IU/L (ref 0–40)
Albumin/Globulin Ratio: 2.4 — ABNORMAL HIGH (ref 1.2–2.2)
Albumin: 4.3 g/dL (ref 3.8–4.8)
BUN/Creatinine Ratio: 17 (ref 10–24)
BUN: 19 mg/dL (ref 8–27)
Bilirubin Total: 0.7 mg/dL (ref 0.0–1.2)
CO2: 23 mmol/L (ref 20–29)
CREATININE: 1.13 mg/dL (ref 0.76–1.27)
Calcium: 9.5 mg/dL (ref 8.6–10.2)
Chloride: 107 mmol/L — ABNORMAL HIGH (ref 96–106)
GFR calc Af Amer: 81 mL/min/{1.73_m2} (ref >59)
GFR calc non Af Amer: 70 mL/min/{1.73_m2} (ref >59)
Globulin, Total: 1.8 g/dL (ref 1.5–4.5)
Glucose: 90 mg/dL (ref 65–99)
Potassium: 4.8 mmol/L (ref 3.5–5.2)
Sodium: 143 mmol/L (ref 134–144)
Total Protein: 6.1 g/dL (ref 6.0–8.5)

## 2018-04-29 LAB — LIPID PANEL
CHOL/HDL RATIO: 3.1 ratio (ref 0.0–5.0)
Cholesterol, Total: 176 mg/dL (ref 100–199)
HDL: 56 mg/dL (ref >39)
LDL Calculated: 105 mg/dL — ABNORMAL HIGH (ref 0–99)
Triglycerides: 74 mg/dL (ref 0–149)
VLDL Cholesterol Cal: 15 mg/dL (ref 5–40)

## 2018-04-29 LAB — HEMOGLOBIN A1C
ESTIMATED AVERAGE GLUCOSE: 111 mg/dL
HEMOGLOBIN A1C: 5.5 % (ref 4.8–5.6)

## 2018-04-29 LAB — CBC
Hematocrit: 48 % (ref 37.5–51.0)
Hemoglobin: 16.5 g/dL (ref 13.0–17.7)
MCH: 29.9 pg (ref 26.6–33.0)
MCHC: 34.4 g/dL (ref 31.5–35.7)
MCV: 87 fL (ref 79–97)
Platelets: 217 10*3/uL (ref 150–450)
RBC: 5.51 x10E6/uL (ref 4.14–5.80)
RDW: 13.2 % (ref 11.6–15.4)
WBC: 5.9 10*3/uL (ref 3.4–10.8)

## 2018-05-03 ENCOUNTER — Telehealth: Payer: Self-pay

## 2018-05-03 NOTE — Telephone Encounter (Signed)
Called patient left message on personal Dr.Croitoru reviewed B/P readings.He advised to restart Lisinopril 2.5 mg daily.Advised to continue to monitor B/P and call back if B/P remains low.

## 2018-05-04 ENCOUNTER — Other Ambulatory Visit: Payer: Self-pay

## 2018-05-04 ENCOUNTER — Ambulatory Visit (INDEPENDENT_AMBULATORY_CARE_PROVIDER_SITE_OTHER): Payer: PRIVATE HEALTH INSURANCE | Admitting: Internal Medicine

## 2018-05-04 ENCOUNTER — Encounter: Payer: Self-pay | Admitting: Internal Medicine

## 2018-05-04 VITALS — BP 114/86 | HR 74 | Temp 98.1°F | Ht 67.4 in | Wt 165.4 lb

## 2018-05-04 DIAGNOSIS — R42 Dizziness and giddiness: Secondary | ICD-10-CM

## 2018-05-04 DIAGNOSIS — I10 Essential (primary) hypertension: Secondary | ICD-10-CM | POA: Diagnosis not present

## 2018-05-04 DIAGNOSIS — Z0000 Encounter for general adult medical examination without abnormal findings: Secondary | ICD-10-CM | POA: Diagnosis not present

## 2018-05-04 LAB — POCT URINALYSIS DIPSTICK
BILIRUBIN UA: NEGATIVE
Blood, UA: NEGATIVE
GLUCOSE UA: NEGATIVE
Ketones, UA: NEGATIVE
Leukocytes, UA: NEGATIVE
NITRITE UA: NEGATIVE
PROTEIN UA: NEGATIVE
SPEC GRAV UA: 1.015 (ref 1.010–1.025)
Urobilinogen, UA: 0.2 E.U./dL
pH, UA: 6 (ref 5.0–8.0)

## 2018-05-04 LAB — POCT UA - MICROALBUMIN
Creatinine, POC: 200 mg/dL
Microalbumin Ur, POC: 10 mg/L

## 2018-05-05 ENCOUNTER — Telehealth: Payer: Self-pay

## 2018-05-05 NOTE — Telephone Encounter (Signed)
Received call from patient he stated he wanted to let Dr.Croitoru know since he has been taking Lisinopril 2.5 mg he has a headache.Stated today is 5th day taking. He has a headache when he wakes up in mornings and through out the day.He has no energy.B/P 138/82 pulse 72.Stated he is not dizzy when he stand up.Advised I will send message to Dr.Croitoru for advice.

## 2018-05-05 NOTE — Telephone Encounter (Signed)
Let's just stop the lisinopril. Please call back with once daily BP readings in one week MCr

## 2018-05-07 ENCOUNTER — Encounter: Payer: Self-pay | Admitting: Internal Medicine

## 2018-05-07 NOTE — Progress Notes (Signed)
Subjective:     Patient ID: Rodney Thompson , male    DOB: 06-14-1955 , 63 y.o.   MRN: [id number]   Chief Complaint  Patient presents with  . Annual Exam  . Hypertension    HPI  He is here today for a full physical examination. He is followed by Dr. Royann Shivers for cardiac care. He reports after his most recent visit, his lisinopril was discontinued by Dr. Salena Saner due to symptoms of dizziness.  His blood pressure then went up, and he was advised to resume lisinopril 2.5mg  daily. His bp remained elevated, so he resumed 5mg  dosage. He feels better. Dizziness occurred with positional changes.   Hypertension  This is a chronic problem. The current episode started more than 1 year ago. The problem has been gradually improving since onset. The problem is controlled. Pertinent negatives include no blurred vision, chest pain, palpitations or sweats.     Past Medical History:  Diagnosis Date  . Hyperlipemia   . Hypertension   . Plantar fasciitis, bilateral 1978     Family History  Problem Relation Age of Onset  . Hypertension Mother 67  . Cancer Father 70  . CVA Paternal Grandfather   . Hypertension Sister   . Hypertension Sister      Current Outpatient Medications:  .  fish oil-omega-3 fatty acids 1000 MG capsule, Take 1 g by mouth daily., Disp: , Rfl:  .  lisinopril (PRINIVIL,ZESTRIL) 2.5 MG tablet, Take 1 tablet (2.5 mg total) by mouth daily., Disp: 30 tablet, Rfl: 11 .  simvastatin (ZOCOR) 40 MG tablet, Take 40 mg by mouth every morning. , Disp: , Rfl:    Allergies  Allergen Reactions  . Simcor [Niacin-Simvastatin Er] Other (See Comments)    flushing  . Vicodin [Hydrocodone-Acetaminophen] Itching and Rash    Men's preventive visit. Patient Health Questionnaire (PHQ-2) is    Office Visit from 05/04/2018 in [facility]  PHQ-2 Total Score  0    . Patient is on a healthy diet. Marital status: Married. Relevant history for alcohol use is:  Social History    Substance and Sexual Activity  Alcohol Use Yes  . Alcohol/week: 14.0 standard drinks  . Types: 14 Standard drinks or equivalent per week  . Relevant history for tobacco use is:  Social History   Tobacco Use  Smoking Status Never Smoker  Smokeless Tobacco Never Used  .  Review of Systems  Constitutional: Negative.   HENT: Negative.   Eyes: Negative.  Negative for blurred vision.  Respiratory: Negative.   Cardiovascular: Negative.  Negative for chest pain and palpitations.  Endocrine: Negative.   Genitourinary: Negative.   Musculoskeletal: Negative.   Skin: Negative.   Allergic/Immunologic: Negative.   Neurological: Positive for dizziness.  Hematological: Negative.   Psychiatric/Behavioral: Negative.      Today's Vitals   05/04/18 1215  BP: 114/86  Pulse: 74  Temp: 98.1 F (36.7 C)  TempSrc: Oral  Weight: 165 lb 6.4 oz (75 kg)  Height: 5' 7.4" (1.712 m)   Body mass index is 25.6 kg/m.   Objective:  Physical Exam Vitals signs and nursing note reviewed.  Constitutional:      Appearance: Normal appearance.  HENT:     Head: Normocephalic and atraumatic.     Right Ear: Tympanic membrane, ear canal and external ear normal.     Left Ear: Tympanic membrane, ear canal and external ear normal.     Nose: Nose normal.     Mouth/Throat:  Mouth: Mucous membranes are moist.     Pharynx: Oropharynx is clear.  Eyes:     Extraocular Movements: Extraocular movements intact.     Conjunctiva/sclera: Conjunctivae normal.     Pupils: Pupils are equal, round, and reactive to light.  Neck:     Musculoskeletal: Normal range of motion and neck supple.  Cardiovascular:     Rate and Rhythm: Normal rate and regular rhythm.     Pulses: Normal pulses.     Heart sounds: Normal heart sounds.  Pulmonary:     Effort: Pulmonary effort is normal.     Breath sounds: Normal breath sounds.  Chest:     Breasts:        Right: Normal. No swelling, bleeding, inverted nipple, mass or  nipple discharge.        Left: Normal. No swelling, bleeding, inverted nipple, mass or nipple discharge.  Abdominal:     General: Abdomen is flat. Bowel sounds are normal.     Palpations: Abdomen is soft.  Genitourinary:    Comments: Deferred as per patient.  Musculoskeletal: Normal range of motion.  Skin:    General: Skin is warm.  Neurological:     General: No focal deficit present.     Mental Status: He is alert.  Psychiatric:        Mood and Affect: Mood normal.        Behavior: Behavior normal.         Assessment And Plan:     1. Routine general medical examination at health care facility  A full exam was performed. DRE deferred as per patient. PATIENT HAS BEEN ADVISED TO GET 30-45 MINUTES REGULAR EXERCISE NO LESS THAN FOUR TO FIVE DAYS PER WEEK - BOTH WEIGHTBEARING EXERCISES AND AEROBIC ARE RECOMMENDED.  HE IS ADVISED TO FOLLOW A HEALTHY DIET WITH AT LEAST SIX FRUITS/VEGGIES PER DAY, DECREASE INTAKE OF RED MEAT, AND TO INCREASE FISH INTAKE TO TWO DAYS PER WEEK.  MEATS/FISH SHOULD NOT BE FRIED, BAKED OR BROILED IS PREFERABLE.  I SUGGEST WEARING SPF 50 SUNSCREEN ON EXPOSED PARTS AND ESPECIALLY WHEN IN THE DIRECT SUNLIGHT FOR AN EXTENDED PERIOD OF TIME.  PLEASE AVOID FAST FOOD RESTAURANTS AND INCREASE YOUR WATER INTAKE.  - POCT Urinalysis Dipstick (81002) - POCT UA - Microalbumin  2. Essential hypertension, benign  Well controlled. He will continue with 5mg  lisinopril. He is encouraged to limit his salt intake. EKG NOT performed, he reports having one recently at cardiology office. He will rto in six months for re-evaluation.   3. Dizziness  Resolving. He is encouraged to stay well hydrated and to drink coconut water when golfing, exercising for long periods of time.   Gwynneth Aliment, MD

## 2018-05-08 NOTE — Telephone Encounter (Signed)
Spoke to patient Dr.Croitoru's advice given.Advised to check B/P every day and call back in 1 week to report readings.

## 2018-07-20 ENCOUNTER — Other Ambulatory Visit: Payer: Self-pay | Admitting: Internal Medicine

## 2018-11-02 ENCOUNTER — Encounter: Payer: Self-pay | Admitting: Internal Medicine

## 2018-11-09 ENCOUNTER — Encounter: Payer: Self-pay | Admitting: Internal Medicine

## 2018-11-09 ENCOUNTER — Other Ambulatory Visit: Payer: Self-pay

## 2018-11-09 ENCOUNTER — Ambulatory Visit (INDEPENDENT_AMBULATORY_CARE_PROVIDER_SITE_OTHER): Payer: PRIVATE HEALTH INSURANCE | Admitting: Internal Medicine

## 2018-11-09 VITALS — BP 118/78 | HR 78 | Temp 98.2°F | Ht 67.4 in | Wt 167.2 lb

## 2018-11-09 DIAGNOSIS — H269 Unspecified cataract: Secondary | ICD-10-CM

## 2018-11-09 DIAGNOSIS — E7800 Pure hypercholesterolemia, unspecified: Secondary | ICD-10-CM | POA: Diagnosis not present

## 2018-11-09 DIAGNOSIS — I10 Essential (primary) hypertension: Secondary | ICD-10-CM

## 2018-11-09 DIAGNOSIS — N400 Enlarged prostate without lower urinary tract symptoms: Secondary | ICD-10-CM | POA: Diagnosis not present

## 2018-11-09 MED ORDER — LISINOPRIL 2.5 MG PO TABS: 2.5000 mg | ORAL_TABLET | Freq: Every day | ORAL | 2 refills | Status: DC

## 2018-11-09 NOTE — Patient Instructions (Signed)
Heart-Healthy Eating Plan °Heart-healthy meal planning includes: °· Eating less unhealthy fats. °· Eating more healthy fats. °· Making other changes in your diet. °Talk with your doctor or a diet specialist (dietitian) to create an eating plan that is right for you. °What is my plan? °Your doctor may recommend an eating plan that includes: °· Total fat: ______% or less of total calories a day. °· Saturated fat: ______% or less of total calories a day. °· Cholesterol: less than _________mg a day. °What are tips for following this plan? °Cooking °Avoid frying your food. Try to bake, boil, grill, or broil it instead. You can also reduce fat by: °· Removing the skin from poultry. °· Removing all visible fats from meats. °· Steaming vegetables in water or broth. °Meal planning ° °· At meals, divide your plate into four equal parts: °? Fill one-half of your plate with vegetables and green salads. °? Fill one-fourth of your plate with whole grains. °? Fill one-fourth of your plate with lean protein foods. °· Eat 4-5 servings of vegetables per day. A serving of vegetables is: °? 1 cup of raw or cooked vegetables. °? 2 cups of raw leafy greens. °· Eat 4-5 servings of fruit per day. A serving of fruit is: °? 1 medium whole fruit. °? ¼ cup of dried fruit. °? ½ cup of fresh, frozen, or canned fruit. °? ½ cup of 100% fruit juice. °· Eat more foods that have soluble fiber. These are apples, broccoli, carrots, beans, peas, and barley. Try to get 20-30 g of fiber per day. °· Eat 4-5 servings of nuts, legumes, and seeds per week: °? 1 serving of dried beans or legumes equals ½ cup after being cooked. °? 1 serving of nuts is ¼ cup. °? 1 serving of seeds equals 1 tablespoon. °General information °· Eat more home-cooked food. Eat less restaurant, buffet, and fast food. °· Limit or avoid alcohol. °· Limit foods that are high in starch and sugar. °· Avoid fried foods. °· Lose weight if you are overweight. °· Keep track of how much salt  (sodium) you eat. This is important if you have high blood pressure. Ask your doctor to tell you more about this. °· Try to add vegetarian meals each week. °Fats °· Choose healthy fats. These include olive oil and canola oil, flaxseeds, walnuts, almonds, and seeds. °· Eat more omega-3 fats. These include salmon, mackerel, sardines, tuna, flaxseed oil, and ground flaxseeds. Try to eat fish at least 2 times each week. °· Check food labels. Avoid foods with trans fats or high amounts of saturated fat. °· Limit saturated fats. °? These are often found in animal products, such as meats, butter, and cream. °? These are also found in plant foods, such as palm oil, palm kernel oil, and coconut oil. °· Avoid foods with partially hydrogenated oils in them. These have trans fats. Examples are stick margarine, some tub margarines, cookies, crackers, and other baked goods. °What foods can I eat? °Fruits °All fresh, canned (in natural juice), or frozen fruits. °Vegetables °Fresh or frozen vegetables (raw, steamed, roasted, or grilled). Green salads. °Grains °Most grains. Choose whole wheat and whole grains most of the time. Rice and pasta, including brown rice and pastas made with whole wheat. °Meats and other proteins °Lean, well-trimmed beef, veal, pork, and lamb. Chicken and turkey without skin. All fish and shellfish. Wild duck, rabbit, pheasant, and venison. Egg whites or low-cholesterol egg substitutes. Dried beans, peas, lentils, and tofu. Seeds and most   nuts. °Dairy °Low-fat or nonfat cheeses, including ricotta and mozzarella. Skim or 1% milk that is liquid, powdered, or evaporated. Buttermilk that is made with low-fat milk. Nonfat or low-fat yogurt. °Fats and oils °Non-hydrogenated (trans-free) margarines. Vegetable oils, including soybean, sesame, sunflower, olive, peanut, safflower, corn, canola, and cottonseed. Salad dressings or mayonnaise made with a vegetable oil. °Beverages °Mineral water. Coffee and tea. Diet  carbonated beverages. °Sweets and desserts °Sherbet, gelatin, and fruit ice. Small amounts of dark chocolate. °Limit all sweets and desserts. °Seasonings and condiments °All seasonings and condiments. °The items listed above may not be a complete list of foods and drinks you can eat. Contact a dietitian for more options. °What foods should I avoid? °Fruits °Canned fruit in heavy syrup. Fruit in cream or butter sauce. Fried fruit. Limit coconut. °Vegetables °Vegetables cooked in cheese, cream, or butter sauce. Fried vegetables. °Grains °Breads that are made with saturated or trans fats, oils, or whole milk. Croissants. Sweet rolls. Donuts. High-fat crackers, such as cheese crackers. °Meats and other proteins °Fatty meats, such as hot dogs, ribs, sausage, bacon, rib-eye roast or steak. High-fat deli meats, such as salami and bologna. Caviar. Domestic duck and goose. Organ meats, such as liver. °Dairy °Cream, sour cream, cream cheese, and creamed cottage cheese. Whole-milk cheeses. Whole or 2% milk that is liquid, evaporated, or condensed. Whole buttermilk. Cream sauce or high-fat cheese sauce. Yogurt that is made from whole milk. °Fats and oils °Meat fat, or shortening. Cocoa butter, hydrogenated oils, palm oil, coconut oil, palm kernel oil. Solid fats and shortenings, including bacon fat, salt pork, lard, and butter. Nondairy cream substitutes. Salad dressings with cheese or sour cream. °Beverages °Regular sodas and juice drinks with added sugar. °Sweets and desserts °Frosting. Pudding. Cookies. Cakes. Pies. Milk chocolate or white chocolate. Buttered syrups. Full-fat ice cream or ice cream drinks. °The items listed above may not be a complete list of foods and drinks to avoid. Contact a dietitian for more information. °Summary °· Heart-healthy meal planning includes eating less unhealthy fats, eating more healthy fats, and making other changes in your diet. °· Eat a balanced diet. This includes fruits and  vegetables, low-fat or nonfat dairy, lean protein, nuts and legumes, whole grains, and heart-healthy oils and fats. °This information is not intended to replace advice given to you by your health care provider. Make sure you discuss any questions you have with your health care provider. °Document Released: 08/10/2011 Document Revised: 04/14/2017 Document Reviewed: 03/18/2017 °Elsevier Patient Education © 2020 [employer] ° °

## 2018-11-10 LAB — CMP14+EGFR
ALT: 34 IU/L (ref 0–44)
AST: 28 IU/L (ref 0–40)
Albumin/Globulin Ratio: 2.4 — ABNORMAL HIGH (ref 1.2–2.2)
Albumin: 4.3 g/dL (ref 3.8–4.8)
Alkaline Phosphatase: 85 IU/L (ref 39–117)
BUN/Creatinine Ratio: 14 (ref 10–24)
BUN: 15 mg/dL (ref 8–27)
Bilirubin Total: 0.7 mg/dL (ref 0.0–1.2)
CO2: 21 mmol/L (ref 20–29)
Calcium: 9.1 mg/dL (ref 8.6–10.2)
Chloride: 105 mmol/L (ref 96–106)
Creatinine, Ser: 1.05 mg/dL (ref 0.76–1.27)
GFR calc Af Amer: 88 mL/min/{1.73_m2} (ref >59)
GFR calc non Af Amer: 76 mL/min/{1.73_m2} (ref >59)
Globulin, Total: 1.8 g/dL (ref 1.5–4.5)
Glucose: 116 mg/dL — ABNORMAL HIGH (ref 65–99)
Potassium: 4.3 mmol/L (ref 3.5–5.2)
Sodium: 139 mmol/L (ref 134–144)
Total Protein: 6.1 g/dL (ref 6.0–8.5)

## 2018-11-10 LAB — PSA: Prostate Specific Ag, Serum: 2.6 ng/mL (ref 0.0–4.0)

## 2018-11-12 NOTE — Progress Notes (Signed)
Subjective:     Patient ID: Rodney Thompson , male    DOB: [birth year] , 63 y.o.   MRN: [id number]   Chief Complaint  Patient presents with  . Follow-up    b/p    HPI  Hypertension This is a chronic problem. The current episode started more than 1 year ago. The problem has been gradually improving since onset. The problem is controlled. Pertinent negatives include no blurred vision, chest pain, palpitations or shortness of breath. Risk factors for coronary artery disease include dyslipidemia and male gender. Past treatments include ACE inhibitors. The current treatment provides moderate improvement.     Past Medical History:  Diagnosis Date  . Hyperlipemia   . Hypertension   . Plantar fasciitis, bilateral 1978     Family History  Problem Relation Age of Onset  . Hypertension Mother 76  . Cancer Father 61  . CVA Paternal Grandfather   . Hypertension Sister   . Hypertension Sister      Current Outpatient Medications:  .  fish oil-omega-3 fatty acids 1000 MG capsule, Take 1 g by mouth daily., Disp: , Rfl:  .  simvastatin (ZOCOR) 40 MG tablet, TAKE 1 TABLET BY MOUTH DAILY, Disp: 90 tablet, Rfl: 1 .  lisinopril (ZESTRIL) 2.5 MG tablet, Take 1 tablet (2.5 mg total) by mouth daily., Disp: 90 tablet, Rfl: 2   Allergies  Allergen Reactions  . Simcor [Niacin-Simvastatin Er] Other (See Comments)    flushing  . Vicodin [Hydrocodone-Acetaminophen] Itching and Rash     Review of Systems  Constitutional: Negative.   Eyes: Negative for blurred vision.       He reports he is having cataract surgery in the near future. This will be performed by Dr. Carlis Abbott.   Respiratory: Negative.  Negative for shortness of breath.   Cardiovascular: Negative.  Negative for chest pain and palpitations.  Gastrointestinal: Negative.   Genitourinary:       Has h/o BPH. He has yet to have Urology eval this year. He would like to have PSA drawn.  Neurological: Negative.   Psychiatric/Behavioral:  Negative.      Today's Vitals   11/09/18 1530  BP: 118/78  Pulse: 78  Temp: 98.2 F (36.8 C)  TempSrc: Oral  Weight: 167 lb 3.2 oz (75.8 kg)  Height: 5' 7.4" (1.712 m)   Body mass index is 25.88 kg/m.   Objective:  Physical Exam Vitals signs and nursing note reviewed.  Constitutional:      Appearance: Normal appearance.  Cardiovascular:     Rate and Rhythm: Normal rate and regular rhythm.     Heart sounds: Normal heart sounds.  Pulmonary:     Effort: Pulmonary effort is normal.     Breath sounds: Normal breath sounds.  Skin:    General: Skin is warm.  Neurological:     General: No focal deficit present.     Mental Status: He is alert.  Psychiatric:        Mood and Affect: Mood normal.         Assessment And Plan:     1. Essential hypertension, benign  Chronic, well controlled. He will continue with current meds. He is encouraged to avoid adding salt to his foods. He will rto in six months for a full physical examination.   - CMP14+EGFR  2. Pure hypercholesterolemia  Chronic. He will continue with current meds. I will check lipid panel at his physical exam in March 2021.   3. Cataract of both eyes,  unspecified cataract type  He will surgical extraction in the near future. Management as per ophthalmology.   4. Benign prostatic hyperplasia without lower urinary tract symptoms  Chronic. I will check PSA today and forward to his urologist, Dr. Diona Fanti for his review.   - PSA       Maximino Greenland, MD    THE PATIENT IS ENCOURAGED TO PRACTICE SOCIAL DISTANCING DUE TO THE COVID-19 PANDEMIC.

## 2018-12-28 ENCOUNTER — Other Ambulatory Visit: Payer: Self-pay

## 2018-12-28 MED ORDER — SIMVASTATIN 40 MG PO TABS: 40.0000 mg | ORAL_TABLET | Freq: Every day | ORAL | 1 refills | Status: DC

## 2019-04-03 ENCOUNTER — Other Ambulatory Visit: Payer: Self-pay | Admitting: *Deleted

## 2019-04-03 MED ORDER — LISINOPRIL 2.5 MG PO TABS: 2.5000 mg | ORAL_TABLET | Freq: Every day | ORAL | 0 refills | Status: DC

## 2019-04-03 NOTE — Telephone Encounter (Signed)
Rx has been sent to the pharmacy electronically. ° °

## 2019-04-23 ENCOUNTER — Other Ambulatory Visit: Payer: Self-pay | Admitting: Internal Medicine

## 2019-05-07 ENCOUNTER — Other Ambulatory Visit: Payer: Self-pay

## 2019-05-07 ENCOUNTER — Other Ambulatory Visit: Payer: PRIVATE HEALTH INSURANCE

## 2019-05-07 DIAGNOSIS — Z0000 Encounter for general adult medical examination without abnormal findings: Secondary | ICD-10-CM

## 2019-05-07 DIAGNOSIS — I10 Essential (primary) hypertension: Secondary | ICD-10-CM

## 2019-05-07 DIAGNOSIS — E7800 Pure hypercholesterolemia, unspecified: Secondary | ICD-10-CM

## 2019-05-07 DIAGNOSIS — E559 Vitamin D deficiency, unspecified: Secondary | ICD-10-CM

## 2019-05-08 LAB — CMP14+EGFR
ALT: 27 IU/L (ref 0–44)
AST: 27 IU/L (ref 0–40)
Albumin/Globulin Ratio: 2.1 (ref 1.2–2.2)
Albumin: 4.1 g/dL (ref 3.8–4.8)
Alkaline Phosphatase: 86 IU/L (ref 39–117)
BUN/Creatinine Ratio: 15 (ref 10–24)
BUN: 15 mg/dL (ref 8–27)
Bilirubin Total: 0.5 mg/dL (ref 0.0–1.2)
CO2: 21 mmol/L (ref 20–29)
Calcium: 8.9 mg/dL (ref 8.6–10.2)
Chloride: 103 mmol/L (ref 96–106)
Creatinine, Ser: 1.03 mg/dL (ref 0.76–1.27)
GFR calc Af Amer: 90 mL/min/{1.73_m2} (ref >59)
GFR calc non Af Amer: 77 mL/min/{1.73_m2} (ref >59)
Globulin, Total: 2 g/dL (ref 1.5–4.5)
Glucose: 100 mg/dL — ABNORMAL HIGH (ref 65–99)
Potassium: 4.1 mmol/L (ref 3.5–5.2)
Sodium: 137 mmol/L (ref 134–144)
Total Protein: 6.1 g/dL (ref 6.0–8.5)

## 2019-05-08 LAB — CBC WITH DIFFERENTIAL/PLATELET
Basophils Absolute: 0 10*3/uL (ref 0.0–0.2)
Basos: 0 %
EOS (ABSOLUTE): 0.1 10*3/uL (ref 0.0–0.4)
Eos: 3 %
Hematocrit: 47.2 % (ref 37.5–51.0)
Hemoglobin: 16.1 g/dL (ref 13.0–17.7)
Immature Grans (Abs): 0 10*3/uL (ref 0.0–0.1)
Immature Granulocytes: 0 %
Lymphocytes Absolute: 1.4 10*3/uL (ref 0.7–3.1)
Lymphs: 29 %
MCH: 29.9 pg (ref 26.6–33.0)
MCHC: 34.1 g/dL (ref 31.5–35.7)
MCV: 88 fL (ref 79–97)
Monocytes Absolute: 0.3 10*3/uL (ref 0.1–0.9)
Monocytes: 6 %
Neutrophils Absolute: 2.9 10*3/uL (ref 1.4–7.0)
Neutrophils: 62 %
Platelets: 209 10*3/uL (ref 150–450)
RBC: 5.38 x10E6/uL (ref 4.14–5.80)
RDW: 13.2 % (ref 11.6–15.4)
WBC: 4.8 10*3/uL (ref 3.4–10.8)

## 2019-05-08 LAB — PSA: Prostate Specific Ag, Serum: 3.9 ng/mL (ref 0.0–4.0)

## 2019-05-08 LAB — VITAMIN D 25 HYDROXY (VIT D DEFICIENCY, FRACTURES): Vit D, 25-Hydroxy: 44.4 ng/mL (ref 30.0–100.0)

## 2019-05-09 ENCOUNTER — Ambulatory Visit (INDEPENDENT_AMBULATORY_CARE_PROVIDER_SITE_OTHER): Payer: PRIVATE HEALTH INSURANCE | Admitting: Internal Medicine

## 2019-05-09 ENCOUNTER — Other Ambulatory Visit: Payer: Self-pay

## 2019-05-09 ENCOUNTER — Encounter: Payer: Self-pay | Admitting: Internal Medicine

## 2019-05-09 VITALS — BP 132/78 | HR 64 | Temp 98.3°F | Ht 67.4 in | Wt 171.6 lb

## 2019-05-09 DIAGNOSIS — Z0000 Encounter for general adult medical examination without abnormal findings: Secondary | ICD-10-CM | POA: Diagnosis not present

## 2019-05-09 DIAGNOSIS — I10 Essential (primary) hypertension: Secondary | ICD-10-CM | POA: Diagnosis not present

## 2019-05-09 DIAGNOSIS — R972 Elevated prostate specific antigen [PSA]: Secondary | ICD-10-CM

## 2019-05-09 LAB — LIPID PANEL WITH LDL/HDL RATIO
Cholesterol, Total: 153 mg/dL (ref 100–199)
HDL: 52 mg/dL (ref >39)
LDL Chol Calc (NIH): 81 mg/dL (ref 0–99)
LDL/HDL Ratio: 1.6 ratio (ref 0.0–3.6)
Triglycerides: 109 mg/dL (ref 0–149)
VLDL Cholesterol Cal: 20 mg/dL (ref 5–40)

## 2019-05-09 LAB — POCT URINALYSIS DIPSTICK
Bilirubin, UA: NEGATIVE
Blood, UA: NEGATIVE
Glucose, UA: NEGATIVE
Ketones, UA: NEGATIVE
Leukocytes, UA: NEGATIVE
Nitrite, UA: NEGATIVE
Protein, UA: NEGATIVE
Spec Grav, UA: 1.025 (ref 1.010–1.025)
Urobilinogen, UA: 0.2 E.U./dL
pH, UA: 5.5 (ref 5.0–8.0)

## 2019-05-09 LAB — POCT UA - MICROALBUMIN
Albumin/Creatinine Ratio, Urine, POC: <30
Creatinine, POC: 200 mg/dL
Microalbumin Ur, POC: 10 mg/L

## 2019-05-09 LAB — TSH: TSH: 3.63 u[IU]/mL (ref 0.450–4.500)

## 2019-05-09 LAB — SPECIMEN STATUS REPORT

## 2019-05-09 NOTE — Patient Instructions (Signed)

## 2019-05-09 NOTE — Progress Notes (Signed)
This visit occurred during the SARS-CoV-2 public health emergency.  Safety protocols were in place, including screening questions prior to the visit, additional usage of staff PPE, and extensive cleaning of exam room while observing appropriate contact time as indicated for disinfecting solutions.  Subjective:     Patient ID: Rodney Thompson , male    DOB: 1955/03/22 , 64 y.o.   MRN: [id number]   Chief Complaint  Patient presents with  . Annual Exam  . Hypertension    HPI  He is here today for a full physical exam. He has no specific concerns or complaints at this time.   Hypertension This is a chronic problem. The current episode started more than 1 year ago. The problem has been gradually improving since onset. The problem is controlled. Pertinent negatives include no blurred vision, chest pain, palpitations or shortness of breath. Risk factors for coronary artery disease include dyslipidemia and male gender. Past treatments include ACE inhibitors. The current treatment provides moderate improvement.     Past Medical History:  Diagnosis Date  . Hyperlipemia   . Hypertension   . Plantar fasciitis, bilateral 1978     Family History  Problem Relation Age of Onset  . Hypertension Mother 77  . Cancer Father 42  . CVA Paternal Grandfather   . Hypertension Sister   . Hypertension Sister      Current Outpatient Medications:  .  fish oil-omega-3 fatty acids 1000 MG capsule, Take 1 g by mouth daily., Disp: , Rfl:  .  lisinopril (ZESTRIL) 2.5 MG tablet, Take 1 tablet (2.5 mg total) by mouth daily., Disp: 90 tablet, Rfl: 0 .  simvastatin (ZOCOR) 40 MG tablet, TAKE 1 TABLET(40 MG) BY MOUTH DAILY, Disp: 90 tablet, Rfl: 1   Allergies  Allergen Reactions  . Simcor [Niacin-Simvastatin Er] Other (See Comments)    flushing  . Vicodin [Hydrocodone-Acetaminophen] Itching and Rash    Men's preventive visit. Patient Health Questionnaire (PHQ-2) is    Office Visit from 05/09/2019 in [facility]  PHQ-2 Total Score  0    . Patient is on a healthy diet. Marital status: Married. Relevant history for alcohol use is:  Social History   Substance and Sexual Activity  Alcohol Use Yes  . Alcohol/week: 14.0 standard drinks  . Types: 14 Standard drinks or equivalent per week  . Relevant history for tobacco use is:  Social History   Tobacco Use  Smoking Status Never Smoker  Smokeless Tobacco Never Used  .  Review of Systems  Constitutional: Negative.   HENT: Negative.   Eyes: Negative.  Negative for blurred vision.  Respiratory: Negative.  Negative for shortness of breath.   Cardiovascular: Negative.  Negative for chest pain and palpitations.  Endocrine: Negative.   Genitourinary: Negative.   Musculoskeletal: Negative.   Skin: Negative.   Allergic/Immunologic: Negative.   Neurological: Negative.   Hematological: Negative.   Psychiatric/Behavioral: Negative.      Today's Vitals   05/09/19 0854 05/09/19 0936  BP: (!) 140/92 132/78  Pulse: 70 64  Temp: 98.3 F (36.8 C)   TempSrc: Oral   Weight: 171 lb 9.6 oz (77.8 kg)   Height: 5' 7.4" (1.712 m)    Body mass index is 26.56 kg/m.   Objective:  Physical Exam Vitals and nursing note reviewed.  Constitutional:      Appearance: Normal appearance.  HENT:     Head: Normocephalic and atraumatic.     Right Ear: Tympanic membrane, ear canal and external  ear normal.     Left Ear: Tympanic membrane, ear canal and external ear normal.     Nose: Nose normal.     Mouth/Throat:     Mouth: Mucous membranes are moist.     Pharynx: Oropharynx is clear.  Eyes:     Extraocular Movements: Extraocular movements intact.     Conjunctiva/sclera: Conjunctivae normal.     Pupils: Pupils are equal, round, and reactive to light.  Cardiovascular:     Rate and Rhythm: Normal rate and regular rhythm.     Pulses: Normal pulses.     Heart sounds: Normal heart sounds.  Pulmonary:     Effort: Pulmonary effort is  normal.     Breath sounds: Normal breath sounds.  Chest:     Breasts:        Right: Normal. No swelling, bleeding, inverted nipple, mass or nipple discharge.        Left: Normal. No swelling, bleeding, inverted nipple, mass or nipple discharge.  Abdominal:     General: Abdomen is flat. Bowel sounds are normal.     Palpations: Abdomen is soft.  Genitourinary:    Comments: Deferred. Musculoskeletal:        General: Normal range of motion.     Cervical back: Normal range of motion and neck supple.  Skin:    General: Skin is warm.  Neurological:     General: No focal deficit present.     Mental Status: He is alert.  Psychiatric:        Mood and Affect: Mood normal.        Behavior: Behavior normal.         Assessment And Plan:     1. Routine general medical examination at health care facility  A full exam was performed.  DRE deferred. He plans to schedule an appointment with his urologist, DR. Dahlstedt in the near future. He had his labwork drawn earlier this week, so we reviewed his results in full detail during his visit.  PATIENT IS ADVISED TO GET 30-45 MINUTES REGULAR EXERCISE NO LESS THAN FOUR TO FIVE DAYS PER WEEK - BOTH WEIGHTBEARING EXERCISES AND AEROBIC ARE RECOMMENDED.  HE IS ADVISED TO FOLLOW A HEALTHY DIET WITH AT LEAST SIX FRUITS/VEGGIES PER DAY, DECREASE INTAKE OF RED MEAT, AND TO INCREASE FISH INTAKE TO TWO DAYS PER WEEK.  MEATS/FISH SHOULD NOT BE FRIED, BAKED OR BROILED IS PREFERABLE.  I SUGGEST WEARING SPF 50 SUNSCREEN ON EXPOSED PARTS AND ESPECIALLY WHEN IN THE DIRECT SUNLIGHT FOR AN EXTENDED PERIOD OF TIME.  PLEASE AVOID FAST FOOD RESTAURANTS AND INCREASE YOUR WATER INTAKE.    2. Essential hypertension, benign  Chronic, fair control. Repeat BP 132/78, better controlled. He will continue with current meds for now. EKG performed, NSR w/o acute changes. He will rto in six months for re-evaluation.   - POCT Urinalysis Dipstick (81002) - POCT UA - Microalbumin - EKG  12-Lead  3. Elevated PSA  PSA within normal range, but PSA velocity has increased. His value has gone from 2.6 to 3.9 in six months. DRE deferred, per patient request. He plans to schedule appt with his urologist, DR. Dahlstedt in the near future. A copy of his lab results will be faxed to his office.    he Gwynneth Aliment, MD    THE PATIENT IS ENCOURAGED TO PRACTICE SOCIAL DISTANCING DUE TO THE COVID-19 PANDEMIC.

## 2019-05-21 ENCOUNTER — Other Ambulatory Visit: Payer: Self-pay

## 2019-05-21 ENCOUNTER — Encounter: Payer: Self-pay | Admitting: Internal Medicine

## 2019-05-21 MED ORDER — LISINOPRIL 5 MG PO TABS: 5.0000 mg | ORAL_TABLET | Freq: Every day | ORAL | 0 refills | Status: DC

## 2019-05-21 MED ORDER — LISINOPRIL 2.5 MG PO TABS: 2.5000 mg | ORAL_TABLET | Freq: Every day | ORAL | 0 refills | Status: DC

## 2019-07-09 ENCOUNTER — Other Ambulatory Visit: Payer: Self-pay | Admitting: Urology

## 2019-07-09 DIAGNOSIS — R972 Elevated prostate specific antigen [PSA]: Secondary | ICD-10-CM

## 2019-08-13 ENCOUNTER — Other Ambulatory Visit: Payer: Self-pay | Admitting: Internal Medicine

## 2019-08-14 ENCOUNTER — Other Ambulatory Visit: Payer: PRIVATE HEALTH INSURANCE

## 2019-08-22 ENCOUNTER — Ambulatory Visit: Payer: PRIVATE HEALTH INSURANCE | Admitting: Cardiovascular Disease

## 2019-10-12 ENCOUNTER — Encounter: Payer: Self-pay | Admitting: Internal Medicine

## 2019-11-12 ENCOUNTER — Other Ambulatory Visit: Payer: Self-pay

## 2019-11-12 ENCOUNTER — Ambulatory Visit (INDEPENDENT_AMBULATORY_CARE_PROVIDER_SITE_OTHER): Payer: PRIVATE HEALTH INSURANCE | Admitting: Internal Medicine

## 2019-11-12 ENCOUNTER — Encounter: Payer: Self-pay | Admitting: Internal Medicine

## 2019-11-12 VITALS — BP 120/60 | HR 70 | Temp 98.1°F | Ht 67.4 in | Wt 166.4 lb

## 2019-11-12 DIAGNOSIS — I10 Essential (primary) hypertension: Secondary | ICD-10-CM | POA: Diagnosis not present

## 2019-11-12 DIAGNOSIS — Z79899 Other long term (current) drug therapy: Secondary | ICD-10-CM

## 2019-11-12 DIAGNOSIS — E7800 Pure hypercholesterolemia, unspecified: Secondary | ICD-10-CM

## 2019-11-12 DIAGNOSIS — Z23 Encounter for immunization: Secondary | ICD-10-CM

## 2019-11-12 DIAGNOSIS — Z114 Encounter for screening for human immunodeficiency virus [HIV]: Secondary | ICD-10-CM | POA: Diagnosis not present

## 2019-11-12 MED ORDER — SIMVASTATIN 40 MG PO TABS: ORAL_TABLET | ORAL | 2 refills | Status: DC

## 2019-11-12 MED ORDER — LISINOPRIL 5 MG PO TABS: ORAL_TABLET | ORAL | 2 refills | Status: DC

## 2019-11-12 MED ORDER — ZOSTER VAC RECOMB ADJUVANTED 50 MCG/0.5ML IM SUSR: 0.5000 mL | Freq: Once | INTRAMUSCULAR | 0 refills | Status: AC

## 2019-11-12 NOTE — Progress Notes (Signed)
I,Tianna Badgett,acting as a Education administrator for Maximino Greenland, MD.,have documented all relevant documentation on the behalf of Maximino Greenland, MD,as directed by  Maximino Greenland, MD while in the presence of Maximino Greenland, MD.  This visit occurred during the SARS-CoV-2 public health emergency.  Safety protocols were in place, including screening questions prior to the visit, additional usage of staff PPE, and extensive cleaning of exam room while observing appropriate contact time as indicated for disinfecting solutions.  Subjective:     Patient ID: Rodney Thompson , male    DOB: 07-11-1955 , 64 y.o.   MRN: [id number]   Chief Complaint  Patient presents with  . Hypertension    HPI  Patient presents for hypertension follow up. He states that he is compliant with medication.   Hypertension This is a chronic problem. The current episode started more than 1 year ago. The problem has been gradually improving since onset. The problem is controlled. Pertinent negatives include no blurred vision, chest pain, palpitations or shortness of breath. Risk factors for coronary artery disease include dyslipidemia and male gender. Past treatments include ACE inhibitors. The current treatment provides moderate improvement.     Past Medical History:  Diagnosis Date  . Hyperlipemia   . Hypertension   . Plantar fasciitis, bilateral 1978     Family History  Problem Relation Age of Onset  . Hypertension Mother 9  . Cancer Father 48  . CVA Paternal Grandfather   . Hypertension Sister   . Hypertension Sister      Current Outpatient Medications:  .  fish oil-omega-3 fatty acids 1000 MG capsule, Take 1 g by mouth daily., Disp: , Rfl:  .  lisinopril (ZESTRIL) 5 MG tablet, TAKE 1 TABLET(5 MG) BY MOUTH DAILY, Disp: 90 tablet, Rfl: 2 .  simvastatin (ZOCOR) 40 MG tablet, TAKE 1 TABLET(40 MG) BY MOUTH DAILY, Disp: 90 tablet, Rfl: 2 .  Zoster Vaccine Adjuvanted [facility]) injection, Inject 0.5 mLs into the muscle  once for 1 dose., Disp: 0.5 mL, Rfl: 0   Allergies  Allergen Reactions  . Simcor [Niacin-Simvastatin Er] Other (See Comments)    flushing  . Vicodin [Hydrocodone-Acetaminophen] Itching and Rash     Review of Systems  Constitutional: Negative.   Eyes: Negative for blurred vision.  Respiratory: Negative.  Negative for shortness of breath.   Cardiovascular: Negative.  Negative for chest pain and palpitations.  Gastrointestinal: Negative.   Neurological: Negative.      Today's Vitals   11/12/19 0851  BP: 120/60  Pulse: 70  Temp: 98.1 F (36.7 C)  TempSrc: Oral  Weight: 166 lb 6.4 oz (75.5 kg)  Height: 5' 7.4" (1.712 m)  PainSc: 0-No pain   Body mass index is 25.75 kg/m.   Objective:  Physical Exam Vitals and nursing note reviewed.  Constitutional:      Appearance: Normal appearance.  Cardiovascular:     Rate and Rhythm: Normal rate and regular rhythm.     Heart sounds: Normal heart sounds.  Pulmonary:     Effort: Pulmonary effort is normal.     Breath sounds: Normal breath sounds.  Skin:    General: Skin is warm.  Neurological:     General: No focal deficit present.     Mental Status: He is alert.  Psychiatric:        Mood and Affect: Mood normal.         Assessment And Plan:     1. Essential hypertension, benign Comments: Chronic, well  controlled. He will continue with current meds. He is encouraged to avoid adding salt to his foods. FU in six months.  - CMP14+EGFR - TSH  2. Encounter for screening for HIV Comments: He agrees to HIV testing today.  - HIV Antibody (routine testing w rflx)  3. Need for influenza vaccination Comments: He was given flu vaccine x 1. Rx for Shingrix was also sent to his pharmacy, advised to wait four weeks to get this vaccine.  - Flu Vaccine QUAD 6+ mos PF IM (Fluarix Quad PF)  4. Drug therapy     Patient was given opportunity to ask questions. Patient verbalized understanding of the plan and was able to repeat key  elements of the plan. All questions were answered to their satisfaction.  Maximino Greenland, MD   I, Maximino Greenland, MD, have reviewed all documentation for this visit. The documentation on 11/12/19 for the exam, diagnosis, procedures, and orders are all accurate and complete.  THE PATIENT IS ENCOURAGED TO PRACTICE SOCIAL DISTANCING DUE TO THE COVID-19 PANDEMIC.

## 2019-11-12 NOTE — Patient Instructions (Signed)

## 2019-11-13 LAB — CMP14+EGFR
ALT: 44 IU/L (ref 0–44)
AST: 26 IU/L (ref 0–40)
Albumin/Globulin Ratio: 2.6 — ABNORMAL HIGH (ref 1.2–2.2)
Albumin: 4.7 g/dL (ref 3.8–4.8)
Alkaline Phosphatase: 98 IU/L (ref 44–121)
BUN/Creatinine Ratio: 14 (ref 10–24)
BUN: 14 mg/dL (ref 8–27)
Bilirubin Total: 0.6 mg/dL (ref 0.0–1.2)
CO2: 19 mmol/L — ABNORMAL LOW (ref 20–29)
Calcium: 9.6 mg/dL (ref 8.6–10.2)
Chloride: 107 mmol/L — ABNORMAL HIGH (ref 96–106)
Creatinine, Ser: 0.97 mg/dL (ref 0.76–1.27)
GFR calc Af Amer: 96 mL/min/{1.73_m2} (ref >59)
GFR calc non Af Amer: 83 mL/min/{1.73_m2} (ref >59)
Globulin, Total: 1.8 g/dL (ref 1.5–4.5)
Glucose: 101 mg/dL — ABNORMAL HIGH (ref 65–99)
Potassium: 5.2 mmol/L (ref 3.5–5.2)
Sodium: 144 mmol/L (ref 134–144)
Total Protein: 6.5 g/dL (ref 6.0–8.5)

## 2019-11-13 LAB — HIV ANTIBODY (ROUTINE TESTING W REFLEX): HIV Screen 4th Generation wRfx: NONREACTIVE

## 2019-11-13 LAB — TSH: TSH: 2.67 u[IU]/mL (ref 0.450–4.500)

## 2019-11-19 ENCOUNTER — Other Ambulatory Visit: Payer: PRIVATE HEALTH INSURANCE

## 2019-11-19 DIAGNOSIS — Z20822 Contact with and (suspected) exposure to covid-19: Secondary | ICD-10-CM

## 2019-11-20 LAB — SARS-COV-2, NAA 2 DAY TAT

## 2019-11-20 LAB — NOVEL CORONAVIRUS, NAA: SARS-CoV-2, NAA: NOT DETECTED

## 2020-02-25 ENCOUNTER — Other Ambulatory Visit: Payer: PRIVATE HEALTH INSURANCE

## 2020-02-25 DIAGNOSIS — Z1152 Encounter for screening for COVID-19: Secondary | ICD-10-CM

## 2020-02-27 LAB — NOVEL CORONAVIRUS, NAA: SARS-CoV-2, NAA: NOT DETECTED

## 2020-02-27 LAB — SARS-COV-2, NAA 2 DAY TAT

## 2020-05-12 ENCOUNTER — Ambulatory Visit (INDEPENDENT_AMBULATORY_CARE_PROVIDER_SITE_OTHER): Payer: PRIVATE HEALTH INSURANCE | Admitting: Internal Medicine

## 2020-05-12 ENCOUNTER — Other Ambulatory Visit: Payer: Self-pay

## 2020-05-12 ENCOUNTER — Encounter: Payer: Self-pay | Admitting: Internal Medicine

## 2020-05-12 VITALS — BP 124/86 | HR 72 | Temp 97.4°F | Ht 67.2 in | Wt 169.4 lb

## 2020-05-12 DIAGNOSIS — Z0000 Encounter for general adult medical examination without abnormal findings: Secondary | ICD-10-CM

## 2020-05-12 DIAGNOSIS — R972 Elevated prostate specific antigen [PSA]: Secondary | ICD-10-CM | POA: Diagnosis not present

## 2020-05-12 DIAGNOSIS — I10 Essential (primary) hypertension: Secondary | ICD-10-CM

## 2020-05-12 LAB — POCT URINALYSIS DIPSTICK
Bilirubin, UA: NEGATIVE
Blood, UA: NEGATIVE
Glucose, UA: NEGATIVE
Ketones, UA: NEGATIVE
Leukocytes, UA: NEGATIVE
Nitrite, UA: NEGATIVE
Protein, UA: NEGATIVE
Spec Grav, UA: 1.015 (ref 1.010–1.025)
Urobilinogen, UA: 0.2 E.U./dL
pH, UA: 7 (ref 5.0–8.0)

## 2020-05-12 LAB — POCT UA - MICROALBUMIN
Albumin/Creatinine Ratio, Urine, POC: <30
Creatinine, POC: 200 mg/dL
Microalbumin Ur, POC: 10 mg/L

## 2020-05-12 NOTE — Progress Notes (Signed)
I,Katawbba Wiggins,acting as a Education administrator for Maximino Greenland, MD.,have documented all relevant documentation on the behalf of Maximino Greenland, MD,as directed by  Maximino Greenland, MD while in the presence of Maximino Greenland, MD.  This visit occurred during the SARS-CoV-2 public health emergency.  Safety protocols were in place, including screening questions prior to the visit, additional usage of staff PPE, and extensive cleaning of exam room while observing appropriate contact time as indicated for disinfecting solutions.  Subjective:     Patient ID: Rodney Thompson , male    DOB: 1955/09/04 , 65 y.o.   MRN: [id number]   Chief Complaint  Patient presents with  . Annual Exam  . Hypertension    HPI  He is here today for a full physical exam. He has no specific concerns or complaints at this time. He recently returned from a trip to [place], [place]. He is followed by Dr. Diona Fanti for his prostate exams. He had prostate biopsy performed last year, this was negative.   Hypertension This is a chronic problem. The current episode started more than 1 year ago. The problem has been gradually improving since onset. The problem is controlled. Pertinent negatives include no blurred vision, chest pain, palpitations or shortness of breath. Risk factors for coronary artery disease include dyslipidemia and male gender. Past treatments include ACE inhibitors. The current treatment provides moderate improvement.     Past Medical History:  Diagnosis Date  . Hyperlipemia   . Hypertension   . Plantar fasciitis, bilateral 1978     Family History  Problem Relation Age of Onset  . Hypertension Mother 71  . Cancer Father 18  . CVA Paternal Grandfather   . Hypertension Sister   . Hypertension Sister      Current Outpatient Medications:  .  fish oil-omega-3 fatty acids 1000 MG capsule, Take 1 g by mouth daily., Disp: , Rfl:  .  lisinopril (ZESTRIL) 5 MG tablet, TAKE 1 TABLET(5 MG) BY MOUTH DAILY, Disp:  90 tablet, Rfl: 2 .  simvastatin (ZOCOR) 40 MG tablet, TAKE 1 TABLET(40 MG) BY MOUTH DAILY, Disp: 90 tablet, Rfl: 2   Allergies  Allergen Reactions  . Simcor [Niacin-Simvastatin Er] Other (See Comments)    flushing  . Vicodin [Hydrocodone-Acetaminophen] Itching and Rash     Men's preventive visit. Patient Health Questionnaire (PHQ-2) is  [place] Office Visit from 05/12/2020 in [facility]  PHQ-2 Total Score 0    . Patient is on a healthy diet. Marital status: Married. Relevant history for alcohol use is:  Social History   Substance and Sexual Activity  Alcohol Use Yes  . Alcohol/week: 14.0 standard drinks  . Types: 14 Standard drinks or equivalent per week  . Relevant history for tobacco use is:  Social History   Tobacco Use  Smoking Status Never Smoker  Smokeless Tobacco Never Used  .   Review of Systems  Constitutional: Negative.   HENT: Negative.   Eyes: Negative.  Negative for blurred vision.  Respiratory: Negative.  Negative for shortness of breath.   Cardiovascular: Negative.  Negative for chest pain and palpitations.  Gastrointestinal: Negative.   Endocrine: Negative.   Genitourinary: Negative.   Musculoskeletal: Negative.   Skin: Negative.   Allergic/Immunologic: Negative.   Neurological: Negative.   Hematological: Negative.   Psychiatric/Behavioral: Negative.      Today's Vitals   05/12/20 0940  BP: 124/86  Pulse: 72  Temp: (!) 97.4 F (36.3 C)  TempSrc: Oral  Weight:  169 lb 6.4 oz (76.8 kg)  Height: 5' 7.2" (1.707 m)   Body mass index is 26.37 kg/m.  Wt Readings from Last 3 Encounters:  05/12/20 169 lb 6.4 oz (76.8 kg)  11/12/19 166 lb 6.4 oz (75.5 kg)  05/09/19 171 lb 9.6 oz (77.8 kg)   Objective:  Physical Exam Vitals and nursing note reviewed.  Constitutional:      Appearance: Normal appearance.  HENT:     Head: Normocephalic and atraumatic.     Right Ear: Tympanic membrane, ear canal and external ear  normal.     Left Ear: Tympanic membrane, ear canal and external ear normal.     Nose:     Comments: Masked     Mouth/Throat:     Comments: Masked  Eyes:     Extraocular Movements: Extraocular movements intact.     Conjunctiva/sclera: Conjunctivae normal.     Pupils: Pupils are equal, round, and reactive to light.  Cardiovascular:     Rate and Rhythm: Normal rate and regular rhythm.     Pulses: Normal pulses.     Heart sounds: Normal heart sounds.  Pulmonary:     Effort: Pulmonary effort is normal.     Breath sounds: Normal breath sounds.  Chest:  Breasts:     Right: Normal. No swelling, bleeding, inverted nipple, mass or nipple discharge.     Left: Normal. No swelling, bleeding, inverted nipple, mass or nipple discharge.    Abdominal:     General: Abdomen is flat. Bowel sounds are normal.     Palpations: Abdomen is soft.  Genitourinary:    Comments: Deferred  Musculoskeletal:        General: Normal range of motion.     Cervical back: Normal range of motion and neck supple.  Skin:    General: Skin is warm.  Neurological:     General: No focal deficit present.     Mental Status: He is alert and oriented to person, place, and time.  Psychiatric:        Mood and Affect: Mood normal.        Behavior: Behavior normal.         Assessment And Plan:    1. Routine general medical examination at health care facility Comments: A full exam was performed. DRE deferred, he is followed by Urology. He is fully vaccinated and boosted. PATIENT IS ADVISED TO GET 30-45 MINUTES REGULAR EXERCISE NO LESS THAN FOUR TO FIVE DAYS PER WEEK - BOTH WEIGHTBEARING EXERCISES AND AEROBIC ARE RECOMMENDED.  PATIENT IS ADVISED TO FOLLOW A HEALTHY DIET WITH AT LEAST SIX FRUITS/VEGGIES PER DAY, DECREASE INTAKE OF RED MEAT, AND TO INCREASE FISH INTAKE TO TWO DAYS PER WEEK.  MEATS/FISH SHOULD NOT BE FRIED, BAKED OR BROILED IS PREFERABLE.  IT IS ALSO IMPORTANT TO CUT BACK ON YOUR SUGAR INTAKE. PLEASE AVOID  ANYTHING WITH ADDED SUGAR, CORN SYRUP OR OTHER SWEETENERS. IF YOU MUST USE A SWEETENER, YOU CAN TRY STEVIA. IT IS ALSO IMPORTANT TO AVOID ARTIFICIALLY SWEETENERS AND DIET BEVERAGES. LASTLY, I SUGGEST WEARING SPF 50 SUNSCREEN ON EXPOSED PARTS AND ESPECIALLY WHEN IN THE DIRECT SUNLIGHT FOR AN EXTENDED PERIOD OF TIME.  PLEASE AVOID FAST FOOD RESTAURANTS AND INCREASE YOUR WATER INTAKE.  - CBC - CMP14+EGFR - PSA - Lipid panel  2. Essential hypertension, benign Comments: Chronic, well controlled. EKG performed, NSR w/o acute changes. He is encouraged to follow a low sodium diet.  He will f/u in 6 months. - EKG 12-Lead -  POCT Urinalysis Dipstick (81002) - POCT UA - Microalbumin  3. Elevated prostate specific antigen (PSA) Comments: I will request biopsy results from Dr. Diona Fanti. I will check PSA today and forward results to Urology.  - PSA     Patient was given opportunity to ask questions. Patient verbalized understanding of the plan and was able to repeat key elements of the plan. All questions were answered to their satisfaction.   I, Maximino Greenland, MD, have reviewed all documentation for this visit. The documentation on 05/12/20 for the exam, diagnosis, procedures, and orders are all accurate and complete.  THE PATIENT IS ENCOURAGED TO PRACTICE SOCIAL DISTANCING DUE TO THE COVID-19 PANDEMIC.

## 2020-05-12 NOTE — Patient Instructions (Signed)

## 2020-05-13 LAB — CMP14+EGFR
ALT: 51 IU/L — ABNORMAL HIGH (ref 0–44)
AST: 28 IU/L (ref 0–40)
Albumin/Globulin Ratio: 2 (ref 1.2–2.2)
Albumin: 4.5 g/dL (ref 3.8–4.8)
Alkaline Phosphatase: 97 IU/L (ref 44–121)
BUN/Creatinine Ratio: 15 (ref 10–24)
BUN: 16 mg/dL (ref 8–27)
Bilirubin Total: 0.8 mg/dL (ref 0.0–1.2)
CO2: 23 mmol/L (ref 20–29)
Calcium: 9.7 mg/dL (ref 8.6–10.2)
Chloride: 102 mmol/L (ref 96–106)
Creatinine, Ser: 1.06 mg/dL (ref 0.76–1.27)
Globulin, Total: 2.3 g/dL (ref 1.5–4.5)
Glucose: 98 mg/dL (ref 65–99)
Potassium: 4.8 mmol/L (ref 3.5–5.2)
Sodium: 140 mmol/L (ref 134–144)
Total Protein: 6.8 g/dL (ref 6.0–8.5)
eGFR: 79 mL/min/{1.73_m2} (ref >59)

## 2020-05-13 LAB — CBC
Hematocrit: 49.8 % (ref 37.5–51.0)
Hemoglobin: 17.2 g/dL (ref 13.0–17.7)
MCH: 29.6 pg (ref 26.6–33.0)
MCHC: 34.5 g/dL (ref 31.5–35.7)
MCV: 86 fL (ref 79–97)
Platelets: 196 10*3/uL (ref 150–450)
RBC: 5.82 x10E6/uL — ABNORMAL HIGH (ref 4.14–5.80)
RDW: 13.3 % (ref 11.6–15.4)
WBC: 6.2 10*3/uL (ref 3.4–10.8)

## 2020-05-13 LAB — LIPID PANEL
Chol/HDL Ratio: 3.2 ratio (ref 0.0–5.0)
Cholesterol, Total: 209 mg/dL — ABNORMAL HIGH (ref 100–199)
HDL: 65 mg/dL (ref >39)
LDL Chol Calc (NIH): 123 mg/dL — ABNORMAL HIGH (ref 0–99)
Triglycerides: 122 mg/dL (ref 0–149)
VLDL Cholesterol Cal: 21 mg/dL (ref 5–40)

## 2020-05-13 LAB — PSA: Prostate Specific Ag, Serum: 2.7 ng/mL (ref 0.0–4.0)

## 2020-05-16 ENCOUNTER — Encounter: Payer: Self-pay | Admitting: Internal Medicine

## 2020-06-18 ENCOUNTER — Other Ambulatory Visit: Payer: Self-pay

## 2020-06-18 MED ORDER — SIMVASTATIN 40 MG PO TABS: ORAL_TABLET | ORAL | 2 refills | Status: DC

## 2020-09-02 ENCOUNTER — Other Ambulatory Visit: Payer: Self-pay | Admitting: Urology

## 2020-09-02 DIAGNOSIS — R972 Elevated prostate specific antigen [PSA]: Secondary | ICD-10-CM

## 2020-10-09 ENCOUNTER — Other Ambulatory Visit: Payer: Self-pay | Admitting: Internal Medicine

## 2020-11-19 ENCOUNTER — Other Ambulatory Visit: Payer: Self-pay

## 2020-11-19 ENCOUNTER — Ambulatory Visit (INDEPENDENT_AMBULATORY_CARE_PROVIDER_SITE_OTHER): Payer: PRIVATE HEALTH INSURANCE | Admitting: Internal Medicine

## 2020-11-19 ENCOUNTER — Encounter: Payer: Self-pay | Admitting: Internal Medicine

## 2020-11-19 VITALS — BP 128/72 | HR 68 | Temp 97.6°F | Ht 66.6 in | Wt 168.0 lb

## 2020-11-19 DIAGNOSIS — R5383 Other fatigue: Secondary | ICD-10-CM | POA: Diagnosis not present

## 2020-11-19 DIAGNOSIS — R0602 Shortness of breath: Secondary | ICD-10-CM | POA: Diagnosis not present

## 2020-11-19 DIAGNOSIS — I10 Essential (primary) hypertension: Secondary | ICD-10-CM

## 2020-11-19 DIAGNOSIS — E7800 Pure hypercholesterolemia, unspecified: Secondary | ICD-10-CM | POA: Diagnosis not present

## 2020-11-19 MED ORDER — SIMVASTATIN 40 MG PO TABS: ORAL_TABLET | ORAL | 2 refills | Status: DC

## 2020-11-19 NOTE — Patient Instructions (Signed)

## 2020-11-19 NOTE — Progress Notes (Signed)
I,Tianna Badgett,acting as a Education administrator for Maximino Greenland, MD.,have documented all relevant documentation on the behalf of Maximino Greenland, MD,as directed by  Maximino Greenland, MD while in the presence of Maximino Greenland, MD.  This visit occurred during the SARS-CoV-2 public health emergency.  Safety protocols were in place, including screening questions prior to the visit, additional usage of staff PPE, and extensive cleaning of exam room while observing appropriate contact time as indicated for disinfecting solutions.  Subjective:     Patient ID: Rodney Thompson , male    DOB: January 26, 1956 , 65 y.o.   MRN: [id number]   Chief Complaint  Patient presents with   Hypertension    HPI  Patient presents for hypertension follow up. He states that he is compliant with medication. He denies headaches, chest pain and palpitations. However, does admit to an episode of SOB that occurred a week ago. States he worked out at [employer], went home early b/c he "didn't feel well". Denies having diaphoresis, fever/chills - however, just felt fatigued. Sx resolved within 24-48 hours. He is not sure what may have triggered his sx. Rapid at-home COVID test was neg. No other family members were ill.   Hypertension This is a chronic problem. The current episode started more than 1 year ago. The problem has been gradually improving since onset. The problem is controlled. Associated symptoms include shortness of breath. Pertinent negatives include no blurred vision, chest pain or palpitations. Risk factors for coronary artery disease include dyslipidemia and male gender. Past treatments include ACE inhibitors. The current treatment provides moderate improvement.    Past Medical History:  Diagnosis Date   Hyperlipemia    Hypertension    Plantar fasciitis, bilateral 1978     Family History  Problem Relation Age of Onset   Hypertension Mother 40   Cancer Father 24   CVA Paternal Grandfather    Hypertension Sister     Hypertension Sister      Current Outpatient Medications:    fish oil-omega-3 fatty acids 1000 MG capsule, Take 1 g by mouth daily., Disp: , Rfl:    lisinopril (ZESTRIL) 5 MG tablet, TAKE 1 TABLET(5 MG) BY MOUTH DAILY, Disp: 90 tablet, Rfl: 2   simvastatin (ZOCOR) 40 MG tablet, TAKE 1 TABLET(40 MG) BY MOUTH DAILY, Disp: 90 tablet, Rfl: 2   Allergies  Allergen Reactions   Simcor [Niacin-Simvastatin Er] Other (See Comments)    flushing   Vicodin [Hydrocodone-Acetaminophen] Itching and Rash     Review of Systems  Constitutional:  Positive for fatigue.  Eyes:  Negative for blurred vision.  Respiratory:  Positive for shortness of breath.   Cardiovascular: Negative.  Negative for chest pain and palpitations.  Gastrointestinal: Negative.   Neurological: Negative.   Psychiatric/Behavioral: Negative.      Today's Vitals   11/19/20 0942  BP: 128/72  Pulse: 68  Temp: 97.6 F (36.4 C)  TempSrc: Oral  Weight: 168 lb (76.2 kg)  Height: 5' 6.6" (1.692 m)   Body mass index is 26.63 kg/m.  Wt Readings from Last 3 Encounters:  11/19/20 168 lb (76.2 kg)  05/12/20 169 lb 6.4 oz (76.8 kg)  11/12/19 166 lb 6.4 oz (75.5 kg)    Objective:  Physical Exam Vitals and nursing note reviewed.  Constitutional:      Appearance: Normal appearance.  HENT:     Head: Normocephalic and atraumatic.     Nose:     Comments: Masked     Mouth/Throat:  Comments: Masked  Eyes:     Extraocular Movements: Extraocular movements intact.  Cardiovascular:     Rate and Rhythm: Normal rate and regular rhythm.     Heart sounds: Normal heart sounds.  Pulmonary:     Effort: Pulmonary effort is normal.     Breath sounds: Normal breath sounds.  Musculoskeletal:     Cervical back: Normal range of motion.  Skin:    General: Skin is warm.  Neurological:     General: No focal deficit present.     Mental Status: He is alert.  Psychiatric:        Mood and Affect: Mood normal.        Assessment And Plan:      1. Essential hypertension, benign Comments: Chronic, well controlled. Encouraged to follow low sodium diet. I will check CMP today. He will rto in six months for a full CPE.  - CMP14+EGFR  2. Pure hypercholesterolemia Comments: Chronic, will check lipid panel today. She will c/w simvastatin 40mg daily. Encouraged to follow heart healthy lifestyle.  - Lipid panel  3. Fatigue, unspecified type Comments: I will check labs as listed below. He is encouraged to stay well hydrated.  - CBC no Diff - TSH  4. Shortness of breath Comments: Sx appear to have resolved. I will check CBC today. EKG performed, NSR w/o acute changes. I will also refer to Cards for further eval.  - CBC no Diff - Ambulatory referral to Cardiology - EKG 12-Lead   Patient was given opportunity to ask questions. Patient verbalized understanding of the plan and was able to repeat key elements of the plan. All questions were answered to their satisfaction.   I, Robyn N Sanders, MD, have reviewed all documentation for this visit. The documentation on 11/19/20 for the exam, diagnosis, procedures, and orders are all accurate and complete.   IF YOU HAVE BEEN REFERRED TO A SPECIALIST, IT MAY TAKE 1-2 WEEKS TO SCHEDULE/PROCESS THE REFERRAL. IF YOU HAVE NOT HEARD FROM US/SPECIALIST IN TWO WEEKS, PLEASE GIVE US A CALL AT [phone].   THE PATIENT IS ENCOURAGED TO PRACTICE SOCIAL DISTANCING DUE TO THE COVID-19 PANDEMIC.    

## 2020-11-20 LAB — CMP14+EGFR
ALT: 38 IU/L (ref 0–44)
AST: 27 IU/L (ref 0–40)
Albumin/Globulin Ratio: 2.6 — ABNORMAL HIGH (ref 1.2–2.2)
Albumin: 4.7 g/dL (ref 3.8–4.8)
Alkaline Phosphatase: 89 IU/L (ref 44–121)
BUN/Creatinine Ratio: 14 (ref 10–24)
BUN: 14 mg/dL (ref 8–27)
Bilirubin Total: 0.8 mg/dL (ref 0.0–1.2)
CO2: 25 mmol/L (ref 20–29)
Calcium: 10 mg/dL (ref 8.6–10.2)
Chloride: 102 mmol/L (ref 96–106)
Creatinine, Ser: 0.97 mg/dL (ref 0.76–1.27)
Globulin, Total: 1.8 g/dL (ref 1.5–4.5)
Glucose: 95 mg/dL (ref 70–99)
Potassium: 5.5 mmol/L — ABNORMAL HIGH (ref 3.5–5.2)
Sodium: 140 mmol/L (ref 134–144)
Total Protein: 6.5 g/dL (ref 6.0–8.5)
eGFR: 88 mL/min/{1.73_m2} (ref >59)

## 2020-11-20 LAB — CBC
Hematocrit: 51.1 % — ABNORMAL HIGH (ref 37.5–51.0)
Hemoglobin: 17.4 g/dL (ref 13.0–17.7)
MCH: 29 pg (ref 26.6–33.0)
MCHC: 34.1 g/dL (ref 31.5–35.7)
MCV: 85 fL (ref 79–97)
Platelets: 221 10*3/uL (ref 150–450)
RBC: 6 x10E6/uL — ABNORMAL HIGH (ref 4.14–5.80)
RDW: 13.3 % (ref 11.6–15.4)
WBC: 5.5 10*3/uL (ref 3.4–10.8)

## 2020-11-20 LAB — TSH: TSH: 3.07 u[IU]/mL (ref 0.450–4.500)

## 2020-11-20 LAB — LIPID PANEL
Chol/HDL Ratio: 3.2 ratio (ref 0.0–5.0)
Cholesterol, Total: 204 mg/dL — ABNORMAL HIGH (ref 100–199)
HDL: 64 mg/dL (ref >39)
LDL Chol Calc (NIH): 123 mg/dL — ABNORMAL HIGH (ref 0–99)
Triglycerides: 93 mg/dL (ref 0–149)
VLDL Cholesterol Cal: 17 mg/dL (ref 5–40)

## 2020-11-26 ENCOUNTER — Encounter: Payer: Self-pay | Admitting: Nurse Practitioner

## 2020-11-26 ENCOUNTER — Other Ambulatory Visit: Payer: Self-pay

## 2020-11-26 ENCOUNTER — Ambulatory Visit (INDEPENDENT_AMBULATORY_CARE_PROVIDER_SITE_OTHER): Payer: PRIVATE HEALTH INSURANCE | Admitting: Nurse Practitioner

## 2020-11-26 DIAGNOSIS — H6501 Acute serous otitis media, right ear: Secondary | ICD-10-CM

## 2020-11-26 DIAGNOSIS — J019 Acute sinusitis, unspecified: Secondary | ICD-10-CM | POA: Diagnosis not present

## 2020-11-26 LAB — POC COVID19 BINAXNOW: SARS Coronavirus 2 Ag: NEGATIVE

## 2020-11-26 LAB — POC INFLUENZA A&B (BINAX/QUICKVUE)
Influenza A, POC: NEGATIVE
Influenza B, POC: NEGATIVE

## 2020-11-26 MED ORDER — AMOXICILLIN-POT CLAVULANATE 875-125 MG PO TABS: 1.0000 | ORAL_TABLET | Freq: Two times a day (BID) | ORAL | 0 refills | Status: AC

## 2020-11-26 NOTE — Patient Instructions (Signed)
Earache, Adult An earache, or ear pain, can be caused by many things, including: An infection. Ear wax buildup. Ear pressure. Something in the ear that should not be there (foreign body). A sore throat. Tooth problems. Jaw problems. Treatment of the earache will depend on the cause. If the cause is not clear or cannot be determined, you may need to watch your symptoms until your earache goes away or until a cause is found. Follow these instructions at home: Medicines Take or apply over-the-counter and prescription medicines only as told by your health care provider. If you were prescribed an antibiotic medicine, use it as told by your health care provider. Do not stop using the antibiotic even if you start to feel better. Do not put anything in your ear other than medicine that is prescribed by your health care provider. Managing pain If directed, apply heat to the affected area as often as told by your health care provider. Use the heat source that your health care provider recommends, such as a moist heat pack or a heating pad. Place a towel between your skin and the heat source. Leave the heat on for 20-30 minutes. Remove the heat if your skin turns bright red. This is especially important if you are unable to feel pain, heat, or cold. You may have a greater risk of getting burned. If directed, put ice on the affected area as often as told by your health care provider. To do this:   Put ice in a plastic bag. Place a towel between your skin and the bag. Leave the ice on for 20 minutes, 2-3 times a day. General instructions Pay attention to any changes in your symptoms. Try resting in an upright position instead of lying down. This may help to reduce pressure in your ear and relieve pain. Chew gum if it helps to relieve your ear pain. Treat any allergies as told by your health care provider. Drink enough fluid to keep your urine pale yellow. It is up to you to get the results of any  tests that were done. Ask your health care provider, or the department that is doing the tests, when your results will be ready. Keep all follow-up visits as told by your health care provider. This is important. Contact a health care provider if: Your pain does not improve within 2 days. Your earache gets worse. You have new symptoms. You have a fever. Get help right away if you: Have a severe headache. Have a stiff neck. Have trouble swallowing. Have redness or swelling behind your ear. Have fluid or blood coming from your ear. Have hearing loss. Feel dizzy. Summary An earache, or ear pain, can be caused by many things. Treatment of the earache will depend on the cause. Follow recommendations from your health care provider to treat your ear pain. If the cause is not clear or cannot be determined, you may need to watch your symptoms until your earache goes away or until a cause is found. Keep all follow-up visits as told by your health care provider. This is important. This information is not intended to replace advice given to you by your health care provider. Make sure you discuss any questions you have with your health care provider. Document Revised: 09/16/2018 Document Reviewed: 09/16/2018 Elsevier Patient Education  2022 Elsevier Inc.  

## 2020-11-26 NOTE — Progress Notes (Signed)
I,Tianna Badgett,acting as a Neurosurgeon for [employer], NP.,have documented all relevant documentation on the behalf of [employer], NP,as directed by  Charlesetta Ivory, NP while in the presence of Charlesetta Ivory, NP.  This visit occurred during the SARS-CoV-2 public health emergency.  Safety protocols were in place, including screening questions prior to the visit, additional usage of staff PPE, and extensive cleaning of exam room while observing appropriate contact time as indicated for disinfecting solutions.  Subjective:     Patient ID: Rodney Thompson , male    DOB: Mar 17, 1955 , 65 y.o.   MRN: [id number]   Chief Complaint  Patient presents with   Sinus Problem    HPI  Patient is here for ear pain and postnasal drip. His right ear is hurting along with his right frontal sinus/ jaw area. Denies fever, HA, coughing, chest pain or SOB. He is vaccinated . No other concerns.   Sinus Problem This is a new problem. The current episode started in the past 7 days. The problem has been gradually improving since onset. There has been no fever. Associated symptoms include ear pain and sinus pressure. Pertinent negatives include no congestion, coughing, headaches, shortness of breath, sneezing or sore throat. Past treatments include nothing.    Past Medical History:  Diagnosis Date   Hyperlipemia    Hypertension    Plantar fasciitis, bilateral 1978     Family History  Problem Relation Age of Onset   Hypertension Mother 100   Cancer Father 65   CVA Paternal Grandfather    Hypertension Sister    Hypertension Sister      Current Outpatient Medications:    amoxicillin-clavulanate (AUGMENTIN) 875-125 MG tablet, Take 1 tablet by mouth 2 (two) times daily for 7 days., Disp: 14 tablet, Rfl: 0   fish oil-omega-3 fatty acids 1000 MG capsule, Take 1 g by mouth daily., Disp: , Rfl:    lisinopril (ZESTRIL) 5 MG tablet, TAKE 1 TABLET(5 MG) BY MOUTH DAILY, Disp: 90 tablet, Rfl: 2    simvastatin (ZOCOR) 40 MG tablet, TAKE 1 TABLET(40 MG) BY MOUTH DAILY, Disp: 90 tablet, Rfl: 2   Allergies  Allergen Reactions   Simcor [Niacin-Simvastatin Er] Other (See Comments)    flushing   Vicodin [Hydrocodone-Acetaminophen] Itching and Rash     Review of Systems  Constitutional:  Negative for fever.  HENT:  Positive for ear pain, postnasal drip and sinus pressure. Negative for congestion, sneezing and sore throat.   Respiratory:  Negative for cough, chest tightness, shortness of breath and wheezing.   Cardiovascular:  Negative for chest pain.  Musculoskeletal:  Negative for arthralgias and myalgias.  Neurological:  Negative for facial asymmetry, numbness and headaches.    There were no vitals filed for this visit. There is no height or weight on file to calculate BMI.   Objective:  Physical Exam Constitutional:      Appearance: Normal appearance.  HENT:     Head: Normocephalic and atraumatic.     Right Ear: Hearing normal. Tenderness present. No drainage. There is no impacted cerumen. No foreign body. There is mastoid tenderness. Tympanic membrane is not bulging.     Left Ear: Tympanic membrane, ear canal and external ear normal. No tenderness. There is no impacted cerumen. No mastoid tenderness.     Nose: Rhinorrhea present.  Eyes:     Pupils: Pupils are equal, round, and reactive to light.  Cardiovascular:     Rate and Rhythm: Normal rate and regular rhythm.  Pulses: Normal pulses.     Heart sounds: Normal heart sounds.  Pulmonary:     Effort: Pulmonary effort is normal. No respiratory distress.     Breath sounds: Normal breath sounds. No wheezing.  Neurological:     Mental Status: He is alert.        Assessment And Plan:     1. Non-recurrent acute serous otitis media of right ear - POC COVID-19 - Novel Coronavirus, NAA ([facility]) - POC Influenza A&B (Binax test) - amoxicillin-clavulanate (AUGMENTIN) 875-125 MG tablet; Take 1 tablet by mouth 2 (two) times  daily for 7 days.  Dispense: 14 tablet; Refill: 0  2. Acute non-recurrent sinusitis, unspecified location - amoxicillin-clavulanate (AUGMENTIN) 875-125 MG tablet; Take 1 tablet by mouth 2 (two) times daily for 7 days.  Dispense: 14 tablet; Refill: 0    The patient was encouraged to call or send a message through MyChart for any questions or concerns.   Follow up: if symptoms persist or do not get better.   Side effects and appropriate use of all the medication(s) were discussed with the patient today. Patient advised to use the medication(s) as directed by their healthcare provider. The patient was encouraged to read, review, and understand all associated package inserts and contact our office with any questions or concerns. The patient accepts the risks of the treatment plan and had an opportunity to ask questions.   Staying healthy and adopting a healthy lifestyle for your overall health is important. You should eat 7 or more servings of fruits and vegetables per day. You should drink plenty of water to keep yourself hydrated and your kidneys healthy. This includes about 65-80+ fluid ounces of water. Limit your intake of animal fats especially for elevated cholesterol. Avoid highly processed food and limit your salt intake if you have hypertension. Avoid foods high in saturated/Trans fats. Along with a healthy diet it is also very important to maintain time for yourself to maintain a healthy mental health with low stress levels. You should get atleast 150 min of moderate intensity exercise weekly for a healthy heart. Along with eating right and exercising, aim for at least 7-9 hours of sleep daily.  Eat more whole grains which includes barley, wheat berries, oats, brown rice and whole wheat pasta. Use healthy plant oils which include olive, soy, corn, sunflower and peanut. Limit your caffeine and sugary drinks. Limit your intake of fast foods. Limit milk and dairy products to one or two daily servings.    Patient was given opportunity to ask questions. Patient verbalized understanding of the plan and was able to repeat key elements of the plan. All questions were answered to their satisfaction.  Raman Houa Ackert, DNP   I, Raman Charnay Nazario have reviewed all documentation for this visit. The documentation on 11/26/20 for the exam, diagnosis, procedures, and orders are all accurate and complete.    IF YOU HAVE BEEN REFERRED TO A SPECIALIST, IT MAY TAKE 1-2 WEEKS TO SCHEDULE/PROCESS THE REFERRAL. IF YOU HAVE NOT HEARD FROM US/SPECIALIST IN TWO WEEKS, PLEASE GIVE [place] A CALL AT [phone].   THE PATIENT IS ENCOURAGED TO PRACTICE SOCIAL DISTANCING DUE TO THE COVID-19 PANDEMIC.

## 2020-11-27 LAB — SARS-COV-2, NAA 2 DAY TAT

## 2020-11-27 LAB — NOVEL CORONAVIRUS, NAA: SARS-CoV-2, NAA: NOT DETECTED

## 2021-01-11 NOTE — Progress Notes (Signed)
Cardiology Office Note:    Date:  01/13/2021   ID:  Rodney Thompson, DOB 1955-11-04, MRN [id number]  PCP:  Dorothyann Peng, MD  Cardiologist:  Thurmon Fair, MD   Referring MD: Dorothyann Peng, MD   Chief Complaint  Patient presents with   Follow-up    dyspnea     History of Present Illness:    Rodney Thompson is a 65 y.o. male with a hx of hypertension and hyperlipidemia.  He is a retired Psychologist, educational.  He is active, goes to the gym 3 days a week and plays golf.  He was last seen by Corine Shelter, PAC in 04/19/2018.  He reported positional dizziness and was orthostatic in the office.  Lisinopril was stopped.  Of notes, stress test in 2013 nonischemic.   He still exercises 3 times weekly and walks golf course two days per week. However, he does note some shortness of breath when walking and talking. He does not have shortness of breath with exercise, but notices SOB sometimes with activity around the house. He has since restarted 5 mg lisinopril - BO 120/78. At times may have some positional dyspnea, but no near-/syncope. He also sleeps very easily when he sits down on the couch at 4:30. He does occasional snore, but not nightly, not obese, not a high suspicion for OSA. We discussed home sleep study - his wife will track his snoring.    Past Medical History:  Diagnosis Date   Hyperlipemia    Hypertension    Plantar fasciitis, bilateral 1978    Past Surgical History:  Procedure Laterality Date   cataract surgery Bilateral 2019   COLONOSCOPY     HEEL SPUR SURGERY Bilateral 1978   Also 1979 and 1989.  Plantar fasciitis   SHOULDER ARTHROSCOPY WITH BICEPSTENOTOMY Left 12/18/2012   Procedure: SHOULDER ARTHROSCOPY, SUBACROMIAL DECOMPRESSION, OPEN BICEPS TENOTOMY;  Surgeon: Mable Paris, MD;  Location: [facility];  Service: Orthopedics;  Laterality: Left;   SHOULDER SURGERY  2012   right   TONSILLECTOMY     US ECHOCARDIOGRAPHY  04/28/11   trace MR and TR; EF =>55%     Current Medications: Current Meds  Medication Sig   fish oil-omega-3 fatty acids 1000 MG capsule Take 1 g by mouth daily.   lisinopril (ZESTRIL) 5 MG tablet TAKE 1 TABLET(5 MG) BY MOUTH DAILY   rosuvastatin (CRESTOR) 20 MG tablet Take 1 tablet (20 mg total) by mouth daily.   [DISCONTINUED] simvastatin (ZOCOR) 40 MG tablet TAKE 1 TABLET(40 MG) BY MOUTH DAILY     Allergies:   Simcor [niacin-simvastatin er] and Vicodin [hydrocodone-acetaminophen]   Social History   Socioeconomic History   Marital status: Married    Spouse name: Not on file   Number of children: Not on file   Years of education: Not on file   Highest education level: Not on file  Occupational History   Not on file  Tobacco Use   Smoking status: Never   Smokeless tobacco: Never  Vaping Use   Vaping Use: Never used  Substance and Sexual Activity   Alcohol use: Yes    Alcohol/week: 14.0 standard drinks    Types: 14 Standard drinks or equivalent per week   Drug use: Never   Sexual activity: Not on file  Other Topics Concern   Not on file  Social History Narrative   Not on file   Social Determinants of Health   Financial Resource Strain: Not on file  Food Insecurity: Not on  file  Transportation Needs: Not on file  Physical Activity: Not on file  Stress: Not on file  Social Connections: Not on file     Family History: The patient's family history includes CVA in his paternal grandfather; Cancer (age of onset: 25) in his father; Hypertension in his sister and sister; Hypertension (age of onset: 35) in his mother.  ROS:   Please see the history of present illness.     All other systems reviewed and are negative.  EKGs/Labs/Other Studies Reviewed:    The following studies were reviewed today:  Echo pending  EKG:  EKG is  ordered today.  The ekg ordered today demonstrates sinus rhythm with NSR 65  Recent Labs: 11/19/2020: ALT 38; BUN 14; Creatinine, Ser 0.97; Hemoglobin 17.4; Platelets 221;  Potassium 5.5; Sodium 140; TSH 3.070  Recent Lipid Panel    Component Value Date/Time   CHOL 204 (H) 11/19/2020 1130   TRIG 93 11/19/2020 1130   HDL 64 11/19/2020 1130   CHOLHDL 3.2 11/19/2020 1130   LDLCALC 123 (H) 11/19/2020 1130    Physical Exam:    VS:  BP 120/78   Pulse 65   Ht 5\' 7"  (1.702 m)   Wt 171 lb 12.8 oz (77.9 kg)   SpO2 98%   BMI 26.91 kg/m     Wt Readings from Last 3 Encounters:  01/13/21 171 lb 12.8 oz (77.9 kg)  11/19/20 168 lb (76.2 kg)  05/12/20 169 lb 6.4 oz (76.8 kg)     GEN: Well nourished, well developed in no acute distress HEENT: Normal NECK: No JVD; No carotid bruits LYMPHATICS: No lymphadenopathy CARDIAC: RRR, no murmurs, rubs, gallops RESPIRATORY:  Clear to auscultation without rales, wheezing or rhonchi  ABDOMEN: Soft, non-tender, non-distended MUSCULOSKELETAL:  No edema; No deformity  SKIN: Warm and dry NEUROLOGIC:  Alert and oriented x 3 PSYCHIATRIC:  Normal affect   ASSESSMENT:    1. Dyspnea, unspecified type   2. Hyperlipidemia, unspecified hyperlipidemia type   3. Fatigue, unspecified type   4. Snoring   5. Primary hypertension   6. Orthostatic hypotension    PLAN:    In order of problems listed above:  Shortness of breath Not when he exercises, but sometimes feels breathless when doing house chores or walking and talking. No murmur on exam, no signs of volume overload. No recent illness, no chest pain. I do not suspect a cardiac etiology, but can rule out with repeat echo.    Daytime fatigue Occasional snoring - discussed tracking his snoring - could be sleep apnea, but may be age-related - he will consider home sleep study   Hypertension Orthostatic hypotension - 5 mg lisinopril - BP well controlled, no change in therapy - only occasional orthostatic symptoms, no LOC   Hyperlipidemia 11/19/2020: Cholesterol, Total 204; HDL 64; LDL Chol Calc (NIH) 123; Triglycerides 93 Continue 40 mg simvastatin and fish  oil.  CVD risk estimated at 12%. We discussed switching to a high intensity statin to lower LDL. Will start 20 mg crestor  and recheck labs in 6 weeks.    Follow up in Dr. 11/21/2020 in 1 year.    Medication Adjustments/Labs and Tests Ordered: Current medicines are reviewed at length with the patient today.  Concerns regarding medicines are outlined above.  Orders Placed This Encounter  Procedures   Lipid panel   Hepatic function panel   EKG 12-Lead   ECHOCARDIOGRAM COMPLETE   Meds ordered this encounter  Medications   rosuvastatin (CRESTOR) 20  MG tablet    Sig: Take 1 tablet (20 mg total) by mouth daily.    Dispense:  90 tablet    Refill:  3    Signed, Marcelino Duster, [place]  01/13/2021 9:21 AM    [facility]

## 2021-01-13 ENCOUNTER — Encounter: Payer: Self-pay | Admitting: Physician Assistant

## 2021-01-13 ENCOUNTER — Other Ambulatory Visit: Payer: Self-pay

## 2021-01-13 ENCOUNTER — Ambulatory Visit (INDEPENDENT_AMBULATORY_CARE_PROVIDER_SITE_OTHER): Payer: PRIVATE HEALTH INSURANCE | Admitting: Physician Assistant

## 2021-01-13 VITALS — BP 120/78 | HR 65 | Ht 67.0 in | Wt 171.8 lb

## 2021-01-13 DIAGNOSIS — I951 Orthostatic hypotension: Secondary | ICD-10-CM

## 2021-01-13 DIAGNOSIS — E785 Hyperlipidemia, unspecified: Secondary | ICD-10-CM

## 2021-01-13 DIAGNOSIS — R0600 Dyspnea, unspecified: Secondary | ICD-10-CM | POA: Diagnosis not present

## 2021-01-13 DIAGNOSIS — R0683 Snoring: Secondary | ICD-10-CM

## 2021-01-13 DIAGNOSIS — I10 Essential (primary) hypertension: Secondary | ICD-10-CM

## 2021-01-13 DIAGNOSIS — R5383 Other fatigue: Secondary | ICD-10-CM

## 2021-01-13 MED ORDER — ROSUVASTATIN CALCIUM 20 MG PO TABS: 20.0000 mg | ORAL_TABLET | Freq: Every day | ORAL | 3 refills | Status: DC

## 2021-01-13 NOTE — Patient Instructions (Addendum)
Medication Instructions:  STOP Simvastatin  START Crestor 20 mg daily  *If you need a refill on your cardiac medications before your next appointment, please call your pharmacy*  Lab Work: Your physician recommends that you return for lab work in 6 weeks:  Fasting Lipid Panel-DO NOT EAT OR DRINK PAST MIDNIGHT. OKAY TO HAVE WATER. Hepatic (Liver) Function Test  If you have labs (blood work) drawn today and your tests are completely normal, you will receive your results only by: MyChart Message (if you have MyChart) OR A paper copy in the mail If you have any lab test that is abnormal or we need to change your treatment, we will call you to review the results.  Testing/Procedures: Your physician has requested that you have an echocardiogram. Echocardiography is a painless test that uses sound waves to create images of your heart. It provides your doctor with information about the size and shape of your heart and how well your heart's chambers and valves are working. This procedure takes approximately one hour. There are no restrictions for this procedure.  Please schedule for 1-2 weeks   Follow-Up: At [facility], you and your health needs are our priority.  As part of our continuing mission to provide you with exceptional heart care, we have created designated Provider Care Teams.  These Care Teams include your primary Cardiologist (physician) and Advanced Practice Providers (APPs -  Physician Assistants and Nurse Practitioners) who all work together to provide you with the care you need, when you need it.  Your next appointment:   1 year(s)  The format for your next appointment:   In Person  Provider:   Thurmon Fair, MD    Other Instructions

## 2021-01-18 ENCOUNTER — Encounter: Payer: Self-pay | Admitting: Cardiovascular Disease

## 2021-01-22 ENCOUNTER — Other Ambulatory Visit (HOSPITAL_BASED_OUTPATIENT_CLINIC_OR_DEPARTMENT_OTHER): Payer: PRIVATE HEALTH INSURANCE

## 2021-01-30 ENCOUNTER — Ambulatory Visit (INDEPENDENT_AMBULATORY_CARE_PROVIDER_SITE_OTHER): Payer: PRIVATE HEALTH INSURANCE

## 2021-01-30 ENCOUNTER — Other Ambulatory Visit: Payer: Self-pay

## 2021-01-30 DIAGNOSIS — R0600 Dyspnea, unspecified: Secondary | ICD-10-CM

## 2021-01-30 LAB — ECHOCARDIOGRAM COMPLETE
AR max vel: 3.93 cm2
AV Area VTI: 4.08 cm2
AV Area mean vel: 3.71 cm2
AV Mean grad: 2 mmHg
AV Peak grad: 4.1 mmHg
Ao pk vel: 1.01 m/s
Area-P 1/2: 2.07 cm2
Calc EF: 53.6 %
S' Lateral: 3.53 cm
Single Plane A2C EF: 48.9 %
Single Plane A4C EF: 55.2 %

## 2021-02-03 ENCOUNTER — Encounter: Payer: Self-pay | Admitting: Internal Medicine

## 2021-02-05 ENCOUNTER — Encounter: Payer: Self-pay | Admitting: Internal Medicine

## 2021-02-10 ENCOUNTER — Other Ambulatory Visit: Payer: Self-pay

## 2021-02-10 ENCOUNTER — Ambulatory Visit (INDEPENDENT_AMBULATORY_CARE_PROVIDER_SITE_OTHER): Payer: PRIVATE HEALTH INSURANCE | Admitting: Internal Medicine

## 2021-02-10 ENCOUNTER — Ambulatory Visit (HOSPITAL_BASED_OUTPATIENT_CLINIC_OR_DEPARTMENT_OTHER)
Admission: RE | Admit: 2021-02-10 | Discharge: 2021-02-10 | Disposition: A | Discharge status: Home / Self Care | Payer: PRIVATE HEALTH INSURANCE | Source: Ambulatory Visit | Attending: Internal Medicine | Admitting: Internal Medicine

## 2021-02-10 ENCOUNTER — Encounter: Payer: Self-pay | Admitting: Internal Medicine

## 2021-02-10 VITALS — BP 110/70 | HR 84 | Temp 98.1°F | Ht 67.0 in | Wt 169.6 lb

## 2021-02-10 DIAGNOSIS — R1013 Epigastric pain: Secondary | ICD-10-CM

## 2021-02-10 DIAGNOSIS — R0602 Shortness of breath: Secondary | ICD-10-CM

## 2021-02-10 NOTE — Progress Notes (Signed)
Jeri Cos [place]ittleton,acting as a Neurosurgeon for Gwynneth Aliment, MD.,have documented all relevant documentation on the behalf of Gwynneth Aliment, MD,as directed by  Gwynneth Aliment, MD while in the presence of Gwynneth Aliment, MD.  This visit occurred during the SARS-CoV-2 public health emergency.  Safety protocols were in place, including screening questions prior to the visit, additional usage of staff PPE, and extensive cleaning of exam room while observing appropriate contact time as indicated for disinfecting solutions.  Subjective:     Patient ID: Rodney Thompson , male    DOB: 1955/09/03 , 65 y.o.   MRN: [id number]   Chief Complaint  Patient presents with   Shortness of Breath    HPI  He presents today for further evaluation of sob. States he recently had Cardio eval, which included echo, and was advised everything is normal. He is concerned b/c he feels his sx are due to hiatal hernia. He contacted GI, who wants him to have Pulmonary eval PRIOR to full GI evaluation.   He does not have SOB when he exercises, which he does on a regular basis. He does have sx when bending over and lifting heavy items. He denies n/v. He occasionally experiences indigestion.   Shortness of Breath This is a recurrent problem. The current episode started more than 1 month ago. The problem has been gradually worsening. Pertinent negatives include no abdominal pain, chest pain, syncope or vomiting. Exacerbated by: Patient stated the SOb starts when he sits down.    Past Medical History:  Diagnosis Date   Hyperlipemia    Hypertension    Plantar fasciitis, bilateral 1978     Family History  Problem Relation Age of Onset   Hypertension Mother 20   Cancer Father 64   CVA Paternal Grandfather    Hypertension Sister    Hypertension Sister      Current Outpatient Medications:    fish oil-omega-3 fatty acids 1000 MG capsule, Take 1 g by mouth daily., Disp: , Rfl:    lisinopril (ZESTRIL) 5 MG tablet, TAKE 1  TABLET(5 MG) BY MOUTH DAILY, Disp: 90 tablet, Rfl: 2   rosuvastatin (CRESTOR) 20 MG tablet, Take 1 tablet (20 mg total) by mouth daily., Disp: 90 tablet, Rfl: 3   Allergies  Allergen Reactions   Simcor [Niacin-Simvastatin Er] Other (See Comments)    flushing   Vicodin [Hydrocodone-Acetaminophen] Itching and Rash     Review of Systems  Constitutional: Negative.   Respiratory:  Positive for shortness of breath.   Cardiovascular: Negative.  Negative for chest pain and syncope.  Gastrointestinal: Negative.  Negative for abdominal distention, abdominal pain, nausea and vomiting.  Neurological: Negative.   Psychiatric/Behavioral: Negative.      Today's Vitals   02/10/21 0846  BP: 110/70  Pulse: 84  Temp: 98.1 F (36.7 C)  Weight: 169 lb 9.6 oz (76.9 kg)  Height: 5\' 7"  (1.702 m)  PainSc: 0-No pain   Body mass index is 26.56 kg/m.  Wt Readings from Last 3 Encounters:  02/10/21 169 lb 9.6 oz (76.9 kg)  01/13/21 171 lb 12.8 oz (77.9 kg)  11/19/20 168 lb (76.2 kg)     Objective:  Physical Exam      Assessment And Plan:     1. Shortness of breath Comments: No cardiac cause is apparent. I will check CBC to r/o anemia. He agrees to CXR. I think Pulmonary eval is needed. However, pt feels his sx are due to hiatal hernia. - DG Chest  2 View; Future - CBC no Diff - Ambulatory referral to Pulmonology  2. Dyspepsia Comments: Advised to stop eating 3 hours prior to bedtime. Will consider barium swallow prior to Pulmonary evaluation.    Patient was given opportunity to ask questions. Patient verbalized understanding of the plan and was able to repeat key elements of the plan. All questions were answered to their satisfaction.   I, Gwynneth Aliment, MD, have reviewed all documentation for this visit. The documentation on 02/24/21 for the exam, diagnosis, procedures, and orders are all accurate and complete.   IF YOU HAVE BEEN REFERRED TO A SPECIALIST, IT MAY TAKE 1-2 WEEKS TO  SCHEDULE/PROCESS THE REFERRAL. IF YOU HAVE NOT HEARD FROM US/SPECIALIST IN TWO WEEKS, PLEASE GIVE [place] A CALL AT [phone] X [phone].   THE PATIENT IS ENCOURAGED TO PRACTICE SOCIAL DISTANCING DUE TO THE COVID-19 PANDEMIC.

## 2021-02-10 NOTE — Patient Instructions (Signed)
Shortness of Breath, Adult Shortness of breath is when a person has trouble breathing or when a person feels like she or he is having trouble breathing in enough air. Shortness of breath could be a sign of a medical problem. Follow these instructions at home: Pollutants Do not use any products that contain nicotine or tobacco. These products include cigarettes, chewing tobacco, and vaping devices, such as e-cigarettes. This also includes cigars and pipes. If you need help quitting, ask your health care provider. Avoid things that can irritate your airways, including: Smoke. This includes campfire smoke, forest fire smoke, and secondhand smoke from tobacco products. Do not smoke or allow others to smoke in your home. Mold. Dust. Air pollution. Chemical fumes. Things that can give you an allergic reaction (allergens) if you have allergies. Common allergens include pollen from grasses or trees and animal dander. Keep your living space clean and free of mold and dust. General instructions Pay attention to any changes in your symptoms. Take over-the-counter and prescription medicines only as told by your health care provider. This includes oxygen therapy and inhaled medicines. Rest as needed. Return to your normal activities as told by your health care provider. Ask your health care provider what activities are safe for you. Keep all follow-up visits. This is important. Contact a health care provider if: Your condition does not improve as soon as expected. You have a hard time doing your normal activities, even after you rest. You have new symptoms. You cannot walk up stairs or exercise the way that you normally do. Get help right away if: Your shortness of breath gets worse. You have shortness of breath when you are resting. You feel light-headed or you faint. You have a cough that is not controlled with medicines. You cough up blood. You have pain with breathing. You have pain in your  chest, arms, shoulders, or abdomen. You have a fever. These symptoms may be an emergency. Get help right away. Call 911. Do not wait to see if the symptoms will go away. Do not drive yourself to the hospital. Summary Shortness of breath is when a person has trouble breathing enough air. It can be a sign of a medical problem. Avoid things that irritate your lungs, such as smoking, pollution, mold, and dust. Pay attention to changes in your symptoms and contact your health care provider if you have a hard time completing daily activities because of shortness of breath. This information is not intended to replace advice given to you by your health care provider. Make sure you discuss any questions you have with your health care provider. Document Revised: 09/27/2020 Document Reviewed: 09/27/2020 Elsevier Patient Education  2022 Elsevier Inc.  

## 2021-02-13 ENCOUNTER — Encounter: Payer: Self-pay | Admitting: Internal Medicine

## 2021-02-18 ENCOUNTER — Other Ambulatory Visit: Payer: Self-pay

## 2021-02-18 ENCOUNTER — Other Ambulatory Visit: Payer: PRIVATE HEALTH INSURANCE

## 2021-02-18 LAB — CBC
Hematocrit: 48.5 % (ref 37.5–51.0)
Hemoglobin: 16.9 g/dL (ref 13.0–17.7)
MCH: 29.8 pg (ref 26.6–33.0)
MCHC: 34.8 g/dL (ref 31.5–35.7)
MCV: 86 fL (ref 79–97)
Platelets: 196 10*3/uL (ref 150–450)
RBC: 5.67 x10E6/uL (ref 4.14–5.80)
RDW: 13 % (ref 11.6–15.4)
WBC: 6.3 10*3/uL (ref 3.4–10.8)

## 2021-02-24 ENCOUNTER — Encounter: Payer: Self-pay | Admitting: Internal Medicine

## 2021-02-25 ENCOUNTER — Telehealth: Payer: Self-pay

## 2021-02-25 ENCOUNTER — Other Ambulatory Visit: Payer: Self-pay | Admitting: Physician Assistant

## 2021-02-25 NOTE — Telephone Encounter (Signed)
I called the pt because Dr. Allyne Gee wanted to know if the pt wanted to have a barium swallow test done ( hiatal hernia) while he waits for the pulmonary evaluation.

## 2021-02-26 LAB — HEPATIC FUNCTION PANEL
ALT: 40 IU/L (ref 0–44)
AST: 29 IU/L (ref 0–40)
Albumin: 4.6 g/dL (ref 3.8–4.8)
Alkaline Phosphatase: 82 IU/L (ref 44–121)
Bilirubin Total: 0.8 mg/dL (ref 0.0–1.2)
Bilirubin, Direct: 0.23 mg/dL (ref 0.00–0.40)
Total Protein: 6 g/dL (ref 6.0–8.5)

## 2021-02-26 LAB — LIPID PANEL
Chol/HDL Ratio: 2.9 ratio (ref 0.0–5.0)
Cholesterol, Total: 164 mg/dL (ref 100–199)
HDL: 57 mg/dL
LDL Chol Calc (NIH): 88 mg/dL (ref 0–99)
Triglycerides: 103 mg/dL (ref 0–149)
VLDL Cholesterol Cal: 19 mg/dL (ref 5–40)

## 2021-03-06 NOTE — Telephone Encounter (Signed)
The patient said yes to having the barium swallowing done.

## 2021-03-10 ENCOUNTER — Other Ambulatory Visit: Payer: Self-pay | Admitting: Internal Medicine

## 2021-03-10 ENCOUNTER — Encounter: Payer: Self-pay | Admitting: Internal Medicine

## 2021-03-10 DIAGNOSIS — R1013 Epigastric pain: Secondary | ICD-10-CM

## 2021-03-10 DIAGNOSIS — K219 Gastro-esophageal reflux disease without esophagitis: Secondary | ICD-10-CM

## 2021-03-11 ENCOUNTER — Other Ambulatory Visit: Payer: Self-pay | Admitting: Internal Medicine

## 2021-03-18 ENCOUNTER — Telehealth: Payer: Self-pay

## 2021-03-18 NOTE — Telephone Encounter (Signed)
° °  Pre-operative Risk Assessment    Patient Name: Rodney Thompson  DOB: Feb 10, 1956 MRN: [id number]      Request for Surgical Clearance    Procedure:   COLONOSCOPY  Date of Surgery:  Clearance TBD                                 Surgeon:  DR Loreta Ave Surgeon's Group or Practice Name:  [facility] Phone number:  [phone] Fax number:  [phone]   Type of Clearance Requested:   - Medical    Type of Anesthesia:  Not Indicated   Additional requests/questions:

## 2021-03-19 ENCOUNTER — Other Ambulatory Visit: Payer: Self-pay

## 2021-03-19 ENCOUNTER — Ambulatory Visit (INDEPENDENT_AMBULATORY_CARE_PROVIDER_SITE_OTHER): Payer: Self-pay | Admitting: Pulmonary Disease

## 2021-03-19 ENCOUNTER — Encounter: Payer: Self-pay | Admitting: Pulmonary Disease

## 2021-03-19 VITALS — BP 128/80 | HR 75 | Temp 97.9°F | Ht 67.75 in | Wt 175.6 lb

## 2021-03-19 DIAGNOSIS — R0602 Shortness of breath: Secondary | ICD-10-CM

## 2021-03-19 DIAGNOSIS — K219 Gastro-esophageal reflux disease without esophagitis: Secondary | ICD-10-CM | POA: Diagnosis not present

## 2021-03-19 NOTE — Telephone Encounter (Signed)
° ° °  Patient Name: Rodney Thompson  DOB: December 25, 1955 MRN: [id number]  Primary Cardiologist: Thurmon Fair, MD  Chart reviewed as part of pre-operative protocol coverage  Dr. Royann Shivers, can you review his recent echocardiogram prior to clearance? He has follow up with Pulmonologist for evaluation of SOB.   Please forward your response to P CV DIV PREOP.   Thank you   Manson Passey, PA 03/19/2021, 9:21 AM

## 2021-03-19 NOTE — Progress Notes (Signed)
Subjective:   PATIENT ID: Rodney Thompson GENDER: male DOB: 1956/01/25, MRN: [id number]   HPI  Chief Complaint  Patient presents with   Consult    SOB when leaning forward   Reason for Visit: New consult for shortness of breath  Rodney Thompson is a 66 year old male with never smoker hypertension and hyperlipidemia who presents as a new consult for shortness of breath.  He was seen by his PCP, Dr. Allyne Gee, on 02/10/2021.  Note was reviewed.  He had recent evaluation with cardiology including echocardiogram for shortness of breath in November 20221.  Testing has been normal.  Patient expressed concern that this was related to his hiatal hernia however GI requested for him to have a pulmonary evaluation first.  He began having shortness of breath starting in November (2 months ago) that has gradually worsened.  It will associated with mid-to-lower chest pain. Episodes can last four months. He regularly exercises however does not have shortness of breath during those times.  It seems to occur when bending over and lifting items or at rest.  Reports reflux. When he walks uphill and talking, he will get short of breath but that will recover in <1 min with rest. Denies cough and wheezing. He goes to the gym 3 days a week. Uses elliptical and weights. He reports his wife minor snoring. Reports he falls asleep easily and sleeps on average 6 hours a night. 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  Past Medical History:  Diagnosis Date   Hyperlipemia    Hypertension    Plantar fasciitis, bilateral 1978     Family History  Problem Relation Age of Onset   Hypertension Mother 57   Cancer Father 58   CVA Paternal Grandfather    Hypertension Sister    Hypertension Sister      Social History    Occupational History   Not on file  Tobacco Use   Smoking status: Never   Smokeless tobacco: Never  Vaping Use   Vaping Use: Never used  Substance and Sexual Activity   Alcohol use: Yes    Alcohol/week: 14.0 standard drinks    Types: 14 Standard drinks or equivalent per week   Drug use: Never   Sexual activity: Not on file    Allergies  Allergen Reactions   Simcor [Niacin-Simvastatin Er] Other (See Comments)    flushing   Vicodin [Hydrocodone-Acetaminophen] Itching and Rash     Outpatient Medications Prior to Visit  Medication Sig Dispense Refill   fish oil-omega-3 fatty acids 1000 MG capsule Take 1 g by mouth daily.     lisinopril (ZESTRIL) 5 MG tablet TAKE 1 TABLET(5 MG) BY MOUTH DAILY 90 tablet 2   rosuvastatin (CRESTOR) 20 MG tablet Take 1 tablet (20 mg total) by mouth daily. 90 tablet 3   No facility-administered medications prior to visit.    Review of Systems  Constitutional:  Negative for chills, diaphoresis, fever, malaise/fatigue and weight loss.  HENT:  Negative for congestion, ear pain and sore throat.   Respiratory:  Positive for shortness of breath. Negative for cough, hemoptysis, sputum production and wheezing.   Cardiovascular:  Negative for chest pain, palpitations and leg swelling.  Gastrointestinal:  Negative for abdominal pain, heartburn and nausea.  Genitourinary:  Negative for frequency.  Musculoskeletal:  Negative for joint pain and myalgias.  Skin:  Negative for  itching and rash.  Neurological:  Negative for dizziness, weakness and headaches.  Endo/Heme/Allergies:  Does not bruise/bleed easily.  Psychiatric/Behavioral:  Negative for depression. The patient is not nervous/anxious.     Objective:   Vitals:   03/19/21 0913  BP: 128/80  Pulse: 75  Temp: 97.9 F (36.6 C)  TempSrc: Oral  SpO2: 98%  Weight: 175 lb 9.6 oz (79.7 kg)  Height: 5' 7.75" (1.721 m)     Physical Exam: General: Well-appearing, no acute distress HENT: [place],  AT Eyes: EOMI, no scleral icterus Respiratory: Clear to auscultation bilaterally.  No crackles, wheezing or rales Cardiovascular: RRR, -M/R/G, no JVD Extremities:-Edema,-tenderness Neuro: AAO x4, CNII-XII grossly intact Psych: Normal mood, normal affect  Data Reviewed:  Imaging: CXR 02/10/2021-normal chest x-ray.  No infiltrate, effusion or edema.  PFT: None on file  Labs: CBC    Component Value Date/Time   WBC 6.3 02/18/2021 1011   RBC 5.67 02/18/2021 1011   HGB 16.9 02/18/2021 1011   HCT 48.5 02/18/2021 1011   PLT 196 02/18/2021 1011   MCV 86 02/18/2021 1011   MCH 29.8 02/18/2021 1011   MCHC 34.8 02/18/2021 1011   RDW 13.0 02/18/2021 1011   LYMPHSABS 1.4 05/07/2019 1424   EOSABS 0.1 05/07/2019 1424   BASOSABS 0.0 05/07/2019 1424   Echo: 01/30/2021-EF 50 to 55% grade 1 DD    Assessment & Plan:   Discussion: 66 year old male with hypertension hyperlipidemia who presents for evaluation for shortness of breath. Etiology of this shortness of breath remains unclear though it sounds nonpulmonary in nature.  Not associated with activity and intermittently occurring with rest, bending and stooping. Low suspicion for undiagnosed obstructive lung disease and no risk factors for restrictive with normal chest imaging.  Patient has high exercise tolerance so do not think bronchodilators would improve his symptoms.  Symptoms could be related to uncontrolled reflux.  He has not tried any over-the-counter meds.  Recommend trial and continuing evaluation with GI.  Shortness of breath, lower chest pain Consider further evaluation with PFTs if GI work-up is negative  Suspected reflux START omeprazole 20 mg twice a day  Health Maintenance Immunization History  Administered Date(s) Administered   Influenza Inj Mdck Quad Pf 12/07/2017   Influenza,inj,Quad PF,6+ Mos 10/24/2018, 11/12/2019   Influenza-Unspecified 10/24/2018, 10/24/2020   PFIZER Comirnaty(Gray Top)Covid-19 Tri-Sucrose  Vaccine 07/11/2020   PFIZER(Purple Top)SARS-COV-2 Vaccination 05/15/2019, 06/05/2019, 12/26/2019, 10/24/2020   Pneumococcal Conjugate-13 01/03/2013   Zoster Recombinat (Shingrix) 03/03/2020   CT Lung Screen - not qualified  No orders of the defined types were placed in this encounter. No orders of the defined types were placed in this encounter.  Return if symptoms worsen or fail to improve.  I have spent a total time of 30-minutes on the day of the appointment reviewing prior documentation, coordinating care and discussing medical diagnosis and plan with the patient/family. Imaging, labs and tests included in this note have been reviewed and interpreted independently by me.  Faron Whitelock Mechele Collin, MD [facility] 03/19/2021 8:34 AM  Office Number [phone]

## 2021-03-19 NOTE — Patient Instructions (Addendum)
Shortness of breath, lower chest pain Consider further evaluation with PFTs if GI work-up is negative  Suspected reflux START omeprazole 20 mg twice a day  Follow-up as needed

## 2021-03-20 ENCOUNTER — Ambulatory Visit (HOSPITAL_COMMUNITY): Payer: PRIVATE HEALTH INSURANCE

## 2021-03-23 ENCOUNTER — Ambulatory Visit (HOSPITAL_COMMUNITY): Payer: PRIVATE HEALTH INSURANCE

## 2021-03-24 ENCOUNTER — Ambulatory Visit (HOSPITAL_COMMUNITY)
Admission: RE | Admit: 2021-03-24 | Discharge: 2021-03-24 | Disposition: A | Discharge status: Home / Self Care | Payer: PRIVATE HEALTH INSURANCE | Source: Ambulatory Visit | Attending: Internal Medicine | Admitting: Internal Medicine

## 2021-03-24 ENCOUNTER — Other Ambulatory Visit: Payer: Self-pay

## 2021-03-24 ENCOUNTER — Other Ambulatory Visit: Payer: Self-pay | Admitting: Internal Medicine

## 2021-03-24 DIAGNOSIS — R1013 Epigastric pain: Secondary | ICD-10-CM

## 2021-03-27 ENCOUNTER — Telehealth: Payer: Self-pay | Admitting: Pulmonary Disease

## 2021-03-27 NOTE — Telephone Encounter (Signed)
Fax received from Dr. Charna Elizabeth with [facility] to perform a Colonoscopy on patient.  Patient needs surgery clearance. Patient was seen on 03/19/2021. Office protocol is a risk assessment can be sent to surgeon if patient has been seen in 60 days or less.   Sending to Dr. Everardo All for risk assessment or recommendations if patient needs to be seen in office prior to surgical procedure.

## 2021-03-27 NOTE — Telephone Encounter (Signed)
No visit needed. No pulmonary risk factors at the time of his clinic visit on 03/19/21.  Peri-operative Assessment of Pulmonary Risk for Non-Thoracic Surgery:  For Mr. Rodney Thompson, risk of perioperative pulmonary complications is increased by: none  Respiratory complications generally occur in 1% of ASA Class I patients, 5% of ASA Class II and 10% of ASA Class III-IV patients These complications rarely result in mortality and include postoperative pneumonia, atelectasis, pulmonary embolism, ARDS and increased time requiring postoperative mechanical ventilation.  Overall, I recommend proceeding with the surgery if the risk for respiratory complications are outweighed by the potential benefits. This will need to be discussed between the patient and surgeon.  To reduce risks of respiratory complications, I recommend: --Pre- and post-operative incentive spirometry performed frequently while awake --Avoiding use of pancuronium during anesthesia. --OOB, encourage mobility post-op  Mechele Collin, M.D. [facility] Pulmonary/Critical Care Medicine 03/27/2021 5:34 PM   See Amion for personal pager For hours between 7 PM to 7 AM, please call Elink for urgent questions

## 2021-04-02 NOTE — Telephone Encounter (Signed)
OV notes and clearance forms have been faxed back to [facility]. Nothing further needed at this time.

## 2021-05-20 ENCOUNTER — Other Ambulatory Visit: Payer: Self-pay

## 2021-05-20 ENCOUNTER — Encounter: Payer: Self-pay | Admitting: Internal Medicine

## 2021-05-20 ENCOUNTER — Ambulatory Visit (INDEPENDENT_AMBULATORY_CARE_PROVIDER_SITE_OTHER): Payer: PRIVATE HEALTH INSURANCE | Admitting: Internal Medicine

## 2021-05-20 VITALS — BP 112/78 | HR 75 | Temp 98.1°F | Ht 67.75 in | Wt 170.8 lb

## 2021-05-20 DIAGNOSIS — Z0000 Encounter for general adult medical examination without abnormal findings: Secondary | ICD-10-CM | POA: Diagnosis not present

## 2021-05-20 DIAGNOSIS — I10 Essential (primary) hypertension: Secondary | ICD-10-CM

## 2021-05-20 DIAGNOSIS — Z6826 Body mass index (BMI) 26.0-26.9, adult: Secondary | ICD-10-CM

## 2021-05-20 LAB — POCT URINALYSIS DIPSTICK
Bilirubin, UA: NEGATIVE
Blood, UA: NEGATIVE
Glucose, UA: NEGATIVE
Ketones, UA: NEGATIVE
Leukocytes, UA: NEGATIVE
Nitrite, UA: NEGATIVE
Protein, UA: NEGATIVE
Spec Grav, UA: 1.02 (ref 1.010–1.025)
Urobilinogen, UA: 0.2 E.U./dL
pH, UA: 6.5 (ref 5.0–8.0)

## 2021-05-20 NOTE — Progress Notes (Deleted)
?Rich Brave Llittleton,acting as a Education administrator for Maximino Greenland, MD.,have documented all relevant documentation on the behalf of Maximino Greenland, MD,as directed by  Maximino Greenland, MD while in the presence of Maximino Greenland, MD.  ?This visit occurred during the SARS-CoV-2 public health emergency.  Safety protocols were in place, including screening questions prior to the visit, additional usage of staff PPE, and extensive cleaning of exam room while observing appropriate contact time as indicated for disinfecting solutions. ? ?Subjective:  ?  ? Patient ID: Rodney Thompson , male    DOB: 1955-10-20 , 66 y.o.   MRN: [id number] ? ? ?Chief Complaint  ?Patient presents with  ? Annual Exam  ? ? ?HPI ? ?He is here today for a full physical exam. He reports compliance with meds. He has no specific concerns or complaints at this time. He is followed by Dr. Diona Fanti for his prostate exams.  ? ?Hypertension ?This is a chronic problem. The current episode started more than 1 year ago. The problem has been gradually improving since onset. The problem is controlled. Pertinent negatives include no blurred vision, chest pain, palpitations or shortness of breath. Risk factors for coronary artery disease include dyslipidemia and male gender. Past treatments include ACE inhibitors. The current treatment provides moderate improvement.   ? ?Past Medical History:  ?Diagnosis Date  ? Hyperlipemia   ? Hypertension   ? Plantar fasciitis, bilateral 1978  ?  ? ?Family History  ?Problem Relation Age of Onset  ? Hypertension Mother 59  ? Cancer Father 61  ? CVA Paternal Grandfather   ? Hypertension Sister   ? Hypertension Sister   ? ? ? ?Current Outpatient Medications:  ?  fish oil-omega-3 fatty acids 1000 MG capsule, Take 1 g by mouth daily., Disp: , Rfl:  ?  lisinopril (ZESTRIL) 5 MG tablet, TAKE 1 TABLET(5 MG) BY MOUTH DAILY, Disp: 90 tablet, Rfl: 2 ?  rosuvastatin (CRESTOR) 20 MG tablet, Take 1 tablet (20 mg total) by mouth daily., Disp: 90  tablet, Rfl: 3  ? ?Allergies  ?Allergen Reactions  ? Simcor [Niacin-Simvastatin Er] Other (See Comments)  ?  flushing  ? Vicodin [Hydrocodone-Acetaminophen] Itching and Rash  ?  ? ?Men's preventive visit. Patient Health Questionnaire (PHQ-2) is  ?[place] Office Visit from 05/20/2021 in [facility]  ?PHQ-2 Total Score 0  ? ?  ?. Patient is on a *** diet. Marital status: Married. Relevant history for alcohol use is:  ?Social History  ? ?Substance and Sexual Activity  ?Alcohol Use Yes  ? Alcohol/week: 14.0 standard drinks  ? Types: 14 Standard drinks or equivalent per week  ?[name] Relevant history for tobacco use is:  ?Social History  ? ?Tobacco Use  ?Smoking Status Never  ?Smokeless Tobacco Never  ?.  ? ?Review of Systems  ?Constitutional: Negative.   ?HENT: Negative.    ?Eyes: Negative.  Negative for blurred vision.  ?Respiratory: Negative.  Negative for shortness of breath.   ?Cardiovascular: Negative.  Negative for chest pain and palpitations.  ?Gastrointestinal: Negative.   ?Endocrine: Negative.   ?Genitourinary: Negative.   ?Musculoskeletal: Negative.   ?Skin: Negative.   ?Allergic/Immunologic: Negative.   ?Neurological: Negative.   ?Hematological: Negative.   ?Psychiatric/Behavioral: Negative.     ? ?Today's Vitals  ? 05/20/21 0841  ?BP: 112/78  ?Pulse: 75  ?Temp: 98.1 ?F (36.7 ?C)  ?Weight: 170 lb 12.8 oz (77.5 kg)  ?PainSc: 0-No pain  ? ?Body mass index is 26.16 kg/m?[name]  ?  Wt Readings from Last 3 Encounters:  ?05/20/21 170 lb 12.8 oz (77.5 kg)  ?03/19/21 175 lb 9.6 oz (79.7 kg)  ?02/10/21 169 lb 9.6 oz (76.9 kg)  ?  ? ?Objective:  ?Physical Exam  ? ?   ?Assessment And Plan:  ?  ?1. Routine general medical examination at health care facility ?Comments: A full exam was performed.  DRE deferred, I will check PSA and forward results to Dr. Diona Fanti.  ?- CBC ?- PSA ?- BMP8+EGFR ? ?2. Essential hypertension, benign ?Comments: Chronic, well controlled. EKG performed, NSR w/o acute changes.  ?-  POCT Urinalysis Dipstick (81002) ?- Microalbumin / creatinine urine ratio ?- EKG 12-Lead ? ?3. BMI 26.0-26.9,adult ?Comments: He is encouraged to aim for at least 150 minutes of exercise per week.  ? ?Patient was given opportunity to ask questions. Patient verbalized understanding of the plan and was able to repeat key elements of the plan. All questions were answered to their satisfaction.  ? ?I, Maximino Greenland, MD, have reviewed all documentation for this visit. The documentation on 05/20/21 for the exam, diagnosis, procedures, and orders are all accurate and complete.  ? ?THE PATIENT IS ENCOURAGED TO PRACTICE SOCIAL DISTANCING DUE TO THE COVID-19 PANDEMIC.   ?

## 2021-05-20 NOTE — Progress Notes (Signed)
?Rich Brave Llittleton,acting as a Education administrator for Maximino Greenland, MD.,have documented all relevant documentation on the behalf of Maximino Greenland, MD,as directed by  Maximino Greenland, MD while in the presence of Maximino Greenland, MD.  ?This visit occurred during the SARS-CoV-2 public health emergency.  Safety protocols were in place, including screening questions prior to the visit, additional usage of staff PPE, and extensive cleaning of exam room while observing appropriate contact time as indicated for disinfecting solutions. ? ?Subjective:  ?  ? Patient ID: Rodney Thompson , male    DOB: [birth year] , 66 y.o.   MRN: [id number] ? ? ?Chief Complaint  ?Patient presents with  ? Annual Exam  ? ? ?HPI ? ?He is here today for a full physical exam. He reports compliance with meds. He has no specific concerns or complaints at this time. He is followed by Dr. Diona Fanti for his prostate exams. He is scheduled for a colonoscopy next Wednesday with Dr. Collene Mares. ? ?Hypertension ?This is a chronic problem. The current episode started more than 1 year ago. The problem has been gradually improving since onset. The problem is controlled. Pertinent negatives include no blurred vision, chest pain, palpitations or shortness of breath. Risk factors for coronary artery disease include dyslipidemia and male gender. Past treatments include ACE inhibitors. The current treatment provides moderate improvement.   ? ?Past Medical History:  ?Diagnosis Date  ? Hyperlipemia   ? Hypertension   ? Plantar fasciitis, bilateral 1978  ?  ? ?Family History  ?Problem Relation Age of Onset  ? Hypertension Mother 67  ? Cancer Father 14  ? CVA Paternal Grandfather   ? Hypertension Sister   ? Hypertension Sister   ? ? ? ?Current Outpatient Medications:  ?  fish oil-omega-3 fatty acids 1000 MG capsule, Take 1 g by mouth daily., Disp: , Rfl:  ?  lisinopril (ZESTRIL) 5 MG tablet, TAKE 1 TABLET(5 MG) BY MOUTH DAILY, Disp: 90 tablet, Rfl: 2 ?  rosuvastatin (CRESTOR) 20 MG  tablet, Take 1 tablet (20 mg total) by mouth daily., Disp: 90 tablet, Rfl: 3  ? ?Allergies  ?Allergen Reactions  ? Simcor [Niacin-Simvastatin Er] Other (See Comments)  ?  flushing  ? Vicodin [Hydrocodone-Acetaminophen] Itching and Rash  ?  ? ?Men's preventive visit. Patient Health Questionnaire (PHQ-2) is  ?[place] Office Visit from 05/20/2021 in [facility]  ?PHQ-2 Total Score 0  ? ?  ?. Patient is on a healthy diet. Marital status: Married. Relevant history for alcohol use is:  ?Social History  ? ?Substance and Sexual Activity  ?Alcohol Use Yes  ? Alcohol/week: 14.0 standard drinks  ? Types: 14 Standard drinks or equivalent per week  ?[name] Relevant history for tobacco use is:  ?Social History  ? ?Tobacco Use  ?Smoking Status Never  ?Smokeless Tobacco Never  ?.  ? ?Review of Systems  ?Constitutional: Negative.   ?HENT: Negative.    ?Eyes: Negative.  Negative for blurred vision.  ?Respiratory: Negative.  Negative for shortness of breath.   ?Cardiovascular: Negative.  Negative for chest pain and palpitations.  ?Gastrointestinal: Negative.   ?Endocrine: Negative.   ?Genitourinary: Negative.   ?Musculoskeletal: Negative.   ?Skin: Negative.   ?Allergic/Immunologic: Negative.   ?Neurological: Negative.   ?Hematological: Negative.   ?Psychiatric/Behavioral: Negative.     ? ?Today's Vitals  ? 05/20/21 0841  ?BP: 112/78  ?Pulse: 75  ?Temp: 98.1 ?F (36.7 ?C)  ?Weight: 170 lb 12.8 oz (77.5 kg)  ?PainSc: 0-No  pain  ? ?Body mass index is 26.16 kg/m?.  ?Wt Readings from Last 3 Encounters:  ?05/20/21 170 lb 12.8 oz (77.5 kg)  ?03/19/21 175 lb 9.6 oz (79.7 kg)  ?02/10/21 169 lb 9.6 oz (76.9 kg)  ?  ? ?Objective:  ?Physical Exam ?Vitals and nursing note reviewed.  ?Constitutional:   ?   Appearance: Normal appearance.  ?HENT:  ?   Head: Normocephalic and atraumatic.  ?   Right Ear: Tympanic membrane, ear canal and external ear normal.  ?   Left Ear: Tympanic membrane, ear canal and external ear normal.  ?    Nose:  ?   Comments: Masked  ?   Mouth/Throat:  ?   Comments: Masked  ?Eyes:  ?   Extraocular Movements: Extraocular movements intact.  ?   Conjunctiva/sclera: Conjunctivae normal.  ?   Pupils: Pupils are equal, round, and reactive to light.  ?Cardiovascular:  ?   Rate and Rhythm: Normal rate and regular rhythm.  ?   Pulses: Normal pulses.  ?   Heart sounds: Normal heart sounds.  ?Pulmonary:  ?   Effort: Pulmonary effort is normal.  ?   Breath sounds: Normal breath sounds.  ?Chest:  ?Breasts: ?   Right: Normal. No swelling, bleeding, inverted nipple, mass or nipple discharge.  ?   Left: Normal. No swelling, bleeding, inverted nipple, mass or nipple discharge.  ?Abdominal:  ?   General: Abdomen is flat. Bowel sounds are normal.  ?   Palpations: Abdomen is soft.  ?Genitourinary: ?   Comments: Deferred  ?Musculoskeletal:     ?   General: Normal range of motion.  ?   Cervical back: Normal range of motion and neck supple.  ?Skin: ?   General: Skin is warm.  ?Neurological:  ?   General: No focal deficit present.  ?   Mental Status: He is alert.  ?Psychiatric:     ?   Mood and Affect: Mood normal.     ?   Behavior: Behavior normal.  ?   ?Assessment And Plan:  ?  ?1. Routine general medical examination at health care facility ?Comments: A full exam was performed.  DRE deferred, I will check PSA and forward results to Dr. Diona Fanti. PATIENT IS ADVISED TO GET 30-45 MINUTES REGULAR EXERCISE NO LESS THAN FOUR TO FIVE DAYS PER WEEK - BOTH WEIGHTBEARING EXERCISES AND AEROBIC ARE RECOMMENDED.  PATIENT IS ADVISED TO FOLLOW A HEALTHY DIET WITH AT LEAST SIX FRUITS/VEGGIES PER DAY, DECREASE INTAKE OF RED MEAT, AND TO INCREASE FISH INTAKE TO TWO DAYS PER WEEK.  MEATS/FISH SHOULD NOT BE FRIED, BAKED OR BROILED IS PREFERABLE.  IT IS ALSO IMPORTANT TO CUT BACK ON YOUR SUGAR INTAKE. PLEASE AVOID ANYTHING WITH ADDED SUGAR, CORN SYRUP OR OTHER SWEETENERS. IF YOU MUST USE A SWEETENER, YOU CAN TRY STEVIA. IT IS ALSO IMPORTANT TO AVOID  ARTIFICIALLY SWEETENERS AND DIET BEVERAGES. LASTLY, I SUGGEST WEARING SPF 50 SUNSCREEN ON EXPOSED PARTS AND ESPECIALLY WHEN IN THE DIRECT SUNLIGHT FOR AN EXTENDED PERIOD OF TIME.  PLEASE AVOID FAST FOOD RESTAURANTS AND INCREASE YOUR WATER INTAKE. ? ?- CBC ?- PSA ?- BMP8+EGFR ? ?2. Essential hypertension, benign ?Comments: Chronic, well controlled. EKG, NSR w/o acute changes.  He will c/w lisinopril $RemoveBefore'5mg'CglimtYdsYlck$  daily. He will rto in 6 months, enocuraged to follow low sodium diet. ?- POCT Urinalysis Dipstick (81002) ?- Microalbumin / creatinine urine ratio ?- EKG 12-Lead ? ?3. BMI 26.0-26.9,adult ?Comments: He is encouraged to aim for at least 150  minutes of exercise per week.  ? ?Patient was given opportunity to ask questions. Patient verbalized understanding of the plan and was able to repeat key elements of the plan. All questions were answered to their satisfaction.  ? ?I, Maximino Greenland, MD, have reviewed all documentation for this visit. The documentation on 05/20/21 for the exam, diagnosis, procedures, and orders are all accurate and complete.  ? ?THE PATIENT IS ENCOURAGED TO PRACTICE SOCIAL DISTANCING DUE TO THE COVID-19 PANDEMIC.   ?

## 2021-05-20 NOTE — Patient Instructions (Signed)

## 2021-05-21 LAB — BMP8+EGFR
BUN/Creatinine Ratio: 16 (ref 10–24)
BUN: 16 mg/dL (ref 8–27)
CO2: 23 mmol/L (ref 20–29)
Calcium: 9.9 mg/dL (ref 8.6–10.2)
Chloride: 102 mmol/L (ref 96–106)
Creatinine, Ser: 1.03 mg/dL (ref 0.76–1.27)
Glucose: 98 mg/dL (ref 70–99)
Potassium: 5.4 mmol/L — ABNORMAL HIGH (ref 3.5–5.2)
Sodium: 140 mmol/L (ref 134–144)
eGFR: 81 mL/min/{1.73_m2} (ref >59)

## 2021-05-21 LAB — CBC
Hematocrit: 52.7 % — ABNORMAL HIGH (ref 37.5–51.0)
Hemoglobin: 17.7 g/dL (ref 13.0–17.7)
MCH: 28.7 pg (ref 26.6–33.0)
MCHC: 33.6 g/dL (ref 31.5–35.7)
MCV: 86 fL (ref 79–97)
Platelets: 206 10*3/uL (ref 150–450)
RBC: 6.16 x10E6/uL — ABNORMAL HIGH (ref 4.14–5.80)
RDW: 13.7 % (ref 11.6–15.4)
WBC: 5.8 10*3/uL (ref 3.4–10.8)

## 2021-05-21 LAB — PSA: Prostate Specific Ag, Serum: 3.3 ng/mL (ref 0.0–4.0)

## 2021-05-27 LAB — HM COLONOSCOPY

## 2021-07-05 ENCOUNTER — Other Ambulatory Visit: Payer: Self-pay | Admitting: Internal Medicine

## 2021-10-21 ENCOUNTER — Ambulatory Visit: Payer: PRIVATE HEALTH INSURANCE | Admitting: Cardiovascular Disease

## 2021-10-27 HISTORY — PX: OTHER SURGICAL HISTORY: SHX169

## 2021-11-17 ENCOUNTER — Encounter: Payer: Self-pay | Admitting: Internal Medicine

## 2021-11-23 ENCOUNTER — Ambulatory Visit (INDEPENDENT_AMBULATORY_CARE_PROVIDER_SITE_OTHER): Payer: Medicare Other | Admitting: Internal Medicine

## 2021-11-23 ENCOUNTER — Encounter: Payer: Self-pay | Admitting: Internal Medicine

## 2021-11-23 VITALS — BP 100/78 | HR 68 | Temp 97.9°F | Ht 67.0 in | Wt 169.6 lb

## 2021-11-23 DIAGNOSIS — E7800 Pure hypercholesterolemia, unspecified: Secondary | ICD-10-CM

## 2021-11-23 DIAGNOSIS — I10 Essential (primary) hypertension: Secondary | ICD-10-CM

## 2021-11-23 DIAGNOSIS — Z23 Encounter for immunization: Secondary | ICD-10-CM

## 2021-11-23 MED ORDER — ROSUVASTATIN CALCIUM 20 MG PO TABS: 20.0000 mg | ORAL_TABLET | Freq: Every day | ORAL | 3 refills | Status: DC

## 2021-11-23 MED ORDER — LISINOPRIL 5 MG PO TABS: ORAL_TABLET | ORAL | 2 refills | Status: DC

## 2021-11-23 NOTE — Patient Instructions (Signed)
Hypertension, Adult ?Hypertension is another name for high blood pressure. High blood pressure forces your heart to work harder to pump blood. This can cause problems over time. ?There are two numbers in a blood pressure reading. There is a top number (systolic) over a bottom number (diastolic). It is best to have a blood pressure that is below 120/80. ?What are the causes? ?The cause of this condition is not known. Some other conditions can lead to high blood pressure. ?What increases the risk? ?Some lifestyle factors can make you more likely to develop high blood pressure: ?Smoking. ?Not getting enough exercise or physical activity. ?Being overweight. ?Having too much fat, sugar, calories, or salt (sodium) in your diet. ?Drinking too much alcohol. ?Other risk factors include: ?Having any of these conditions: ?Heart disease. ?Diabetes. ?High cholesterol. ?Kidney disease. ?Obstructive sleep apnea. ?Having a family history of high blood pressure and high cholesterol. ?Age. The risk increases with age. ?Stress. ?What are the signs or symptoms? ?High blood pressure may not cause symptoms. Very high blood pressure (hypertensive crisis) may cause: ?Headache. ?Fast or uneven heartbeats (palpitations). ?Shortness of breath. ?Nosebleed. ?Vomiting or feeling like you may vomit (nauseous). ?Changes in how you see. ?Very bad chest pain. ?Feeling dizzy. ?Seizures. ?How is this treated? ?This condition is treated by making healthy lifestyle changes, such as: ?Eating healthy foods. ?Exercising more. ?Drinking less alcohol. ?Your doctor may prescribe medicine if lifestyle changes do not help enough and if: ?Your top number is above 130. ?Your bottom number is above 80. ?Your personal target blood pressure may vary. ?Follow these instructions at home: ?Eating and drinking ? ?If told, follow the DASH eating plan. To follow this plan: ?Fill one half of your plate at each meal with fruits and vegetables. ?Fill one fourth of your plate  at each meal with whole grains. Whole grains include whole-wheat pasta, brown rice, and whole-grain bread. ?Eat or drink low-fat dairy products, such as skim milk or low-fat yogurt. ?Fill one fourth of your plate at each meal with low-fat (lean) proteins. Low-fat proteins include fish, chicken without skin, eggs, beans, and tofu. ?Avoid fatty meat, cured and processed meat, or chicken with skin. ?Avoid pre-made or processed food. ?Limit the amount of salt in your diet to less than 1,500 mg each day. ?Do not drink alcohol if: ?Your doctor tells you not to drink. ?You are pregnant, may be pregnant, or are planning to become pregnant. ?If you drink alcohol: ?Limit how much you have to: ?0-1 drink a day for women. ?0-2 drinks a day for men. ?Know how much alcohol is in your drink. In the [place] one drink equals one 12 oz bottle of beer (355 mL), one 5 oz glass of wine (148 mL), or one 1? oz glass of hard liquor (44 mL). ?Lifestyle ? ?Work with your doctor to stay at a healthy weight or to lose weight. Ask your doctor what the best weight is for you. ?Get at least 30 minutes of exercise that causes your heart to beat faster (aerobic exercise) most days of the week. This may include walking, swimming, or biking. ?Get at least 30 minutes of exercise that strengthens your muscles (resistance exercise) at least 3 days a week. This may include lifting weights or doing Pilates. ?Do not smoke or use any products that contain nicotine or tobacco. If you need help quitting, ask your doctor. ?Check your blood pressure at home as told by your doctor. ?Keep all follow-up visits. ?Medicines ?Take over-the-counter and prescription medicines   only as told by your doctor. Follow directions carefully. ?Do not skip doses of blood pressure medicine. The medicine does not work as well if you skip doses. Skipping doses also puts you at risk for problems. ?Ask your doctor about side effects or reactions to medicines that you should watch  for. ?Contact a doctor if: ?You think you are having a reaction to the medicine you are taking. ?You have headaches that keep coming back. ?You feel dizzy. ?You have swelling in your ankles. ?You have trouble with your vision. ?Get help right away if: ?You get a very bad headache. ?You start to feel mixed up (confused). ?You feel weak or numb. ?You feel faint. ?You have very bad pain in your: ?Chest. ?Belly (abdomen). ?You vomit more than once. ?You have trouble breathing. ?These symptoms may be an emergency. Get help right away. Call 911. ?Do not wait to see if the symptoms will go away. ?Do not drive yourself to the hospital. ?Summary ?Hypertension is another name for high blood pressure. ?High blood pressure forces your heart to work harder to pump blood. ?For most people, a normal blood pressure is less than 120/80. ?Making healthy choices can help lower blood pressure. If your blood pressure does not get lower with healthy choices, you may need to take medicine. ?This information is not intended to replace advice given to you by your health care provider. Make sure you discuss any questions you have with your health care provider. ?Document Revised: 11/27/2020 Document Reviewed: 11/27/2020 ?Elsevier Patient Education ? 2023 Elsevier Inc[employer] ? ?

## 2021-11-23 NOTE — Progress Notes (Signed)
I,Victoria T Hamilton,acting as a scribe for Maximino Greenland, MD.,have documented all relevant documentation on the behalf of Maximino Greenland, MD,as directed by  Maximino Greenland, MD while in the presence of Maximino Greenland, MD.    Subjective:     Patient ID: Rodney Thompson , male    DOB: 1956-02-19 , 66 y.o.   MRN: [id number]   Chief Complaint  Patient presents with   Hypertension    HPI  Patient presents for hypertension follow up. He states that he is compliant with medication. Denies headache, chest pain, and blurred vision. He adds his shortness of breath has resolved as well.    Of note, he had right rotator cuff surgery performed by Dr. Tamera Punt on 10/27/21.   Hypertension This is a chronic problem. The current episode started more than 1 year ago. The problem has been gradually improving since onset. The problem is controlled. Pertinent negatives include no blurred vision, chest pain or palpitations. Risk factors for coronary artery disease include dyslipidemia and male gender. Past treatments include ACE inhibitors. The current treatment provides moderate improvement.     Past Medical History:  Diagnosis Date   Hyperlipemia    Hypertension    Plantar fasciitis, bilateral 1978     Family History  Problem Relation Age of Onset   Hypertension Mother 54   Cancer Father 13   CVA Paternal Grandfather    Hypertension Sister    Hypertension Sister      Current Outpatient Medications:    fish oil-omega-3 fatty acids 1000 MG capsule, Take 1 g by mouth daily., Disp: , Rfl:    lisinopril (ZESTRIL) 5 MG tablet, TAKE 1 TABLET(5 MG) BY MOUTH DAILY, Disp: 90 tablet, Rfl: 2   rosuvastatin (CRESTOR) 20 MG tablet, Take 1 tablet (20 mg total) by mouth daily., Disp: 90 tablet, Rfl: 3   Allergies  Allergen Reactions   Simcor [Niacin-Simvastatin Er] Other (See Comments)    flushing   Vicodin [Hydrocodone-Acetaminophen] Itching and Rash     Review of Systems  Constitutional:  Negative.   HENT: Negative.    Eyes:  Negative for blurred vision.  Respiratory: Negative.    Cardiovascular: Negative.  Negative for chest pain and palpitations.  Gastrointestinal: Negative.   Endocrine: Negative for cold intolerance.  Genitourinary: Negative.   Skin: Negative.   Neurological: Negative.   Psychiatric/Behavioral: Negative.       Today's Vitals   11/23/21 0830  BP: 100/78  Pulse: 68  Temp: 97.9 F (36.6 C)  SpO2: 98%  Weight: 169 lb 9.6 oz (76.9 kg)  Height: _0  (1.702 m)   Body mass index is 26.56 kg/m.  Wt Readings from Last 3 Encounters:  11/23/21 169 lb 9.6 oz (76.9 kg)  05/20/21 170 lb 12.8 oz (77.5 kg)  03/19/21 175 lb 9.6 oz (79.7 kg)    Objective:  Physical Exam Vitals and nursing note reviewed.  Constitutional:      Appearance: Normal appearance.  HENT:     Head: Normocephalic and atraumatic.  Eyes:     Extraocular Movements: Extraocular movements intact.  Cardiovascular:     Rate and Rhythm: Normal rate and regular rhythm.     Heart sounds: Normal heart sounds.  Pulmonary:     Effort: Pulmonary effort is normal.     Breath sounds: Normal breath sounds.  Musculoskeletal:     Cervical back: Normal range of motion.     Comments: Right arm in sling  Skin:  General: Skin is warm.  Neurological:     General: No focal deficit present.     Mental Status: He is alert.  Psychiatric:        Mood and Affect: Mood normal.       Assessment And Plan:     1. Essential hypertension, benign Comments: Chronic, well controlled. He will c/w lisinopril 11m daily. He is encouraged to limit his intake of salty foods. He will f/u in six months for CPE.  - CMP14+EGFR - Lipid panel  2. Pure hypercholesterolemia Comments: Chronic, he will continue with rosuvastatin 283mdaily.  - CMP14+EGFR - Lipid panel  3. Immunization due - Flu Vaccine QUAD High Dose(Fluad)   Patient was given opportunity to ask questions. Patient verbalized understanding  of the plan and was able to repeat key elements of the plan. All questions were answered to their satisfaction.   I, RoMaximino GreenlandMD, have reviewed all documentation for this visit. The documentation on 11/23/21 for the exam, diagnosis, procedures, and orders are all accurate and complete.   IF YOU HAVE BEEN REFERRED TO A SPECIALIST, IT MAY TAKE 1-2 WEEKS TO SCHEDULE/PROCESS THE REFERRAL. IF YOU HAVE NOT HEARD FROM US/SPECIALIST IN TWO WEEKS, PLEASE GIVE [place] CALL AT [phone] X [phone].   THE PATIENT IS ENCOURAGED TO PRACTICE SOCIAL DISTANCING DUE TO THE COVID-19 PANDEMIC.

## 2021-11-24 LAB — CMP14+EGFR
ALT: 41 IU/L (ref 0–44)
AST: 31 IU/L (ref 0–40)
Albumin/Globulin Ratio: 2.6 — ABNORMAL HIGH (ref 1.2–2.2)
Albumin: 4.6 g/dL (ref 3.9–4.9)
Alkaline Phosphatase: 100 IU/L (ref 44–121)
BUN/Creatinine Ratio: 16 (ref 10–24)
BUN: 17 mg/dL (ref 8–27)
Bilirubin Total: 1 mg/dL (ref 0.0–1.2)
CO2: 23 mmol/L (ref 20–29)
Calcium: 9.5 mg/dL (ref 8.6–10.2)
Chloride: 103 mmol/L (ref 96–106)
Creatinine, Ser: 1.08 mg/dL (ref 0.76–1.27)
Globulin, Total: 1.8 g/dL (ref 1.5–4.5)
Glucose: 91 mg/dL (ref 70–99)
Potassium: 4.6 mmol/L (ref 3.5–5.2)
Sodium: 138 mmol/L (ref 134–144)
Total Protein: 6.4 g/dL (ref 6.0–8.5)
eGFR: 76 mL/min/{1.73_m2} (ref >59)

## 2021-11-24 LAB — LIPID PANEL
Chol/HDL Ratio: 2.8 ratio (ref 0.0–5.0)
Cholesterol, Total: 154 mg/dL (ref 100–199)
HDL: 56 mg/dL (ref >39)
LDL Chol Calc (NIH): 79 mg/dL (ref 0–99)
Triglycerides: 105 mg/dL (ref 0–149)
VLDL Cholesterol Cal: 19 mg/dL (ref 5–40)

## 2021-11-26 NOTE — Progress Notes (Deleted)
Cardiology Office Note:    Date:  11/26/2021   ID:  Rodney Thompson, DOB 1955-09-13, MRN [id number]  PCP:  Rodney Thompson, [place] Providers Cardiologist:  Rodney Klein, MD { Click to update primary MD,subspecialty MD or APP then REFRESH:1}    Referring MD: Rodney Chard, MD   No chief complaint on file. ***  History of Present Illness:    Rodney Thompson is a 66 y.o. male with a hx of hypertension and hyperlipidemia.  He is a retired Programme researcher, broadcasting/film/video.  He is active, goes to the gym 3 days a week and plays golf.  He was seen by Rodney Thompson, [place] in 04/19/2018.  He reported positional dizziness and was orthostatic in the office.  Lisinopril was stopped.  Stress test in 2013 was nonischemic. I saw him for follow up 01/13/21. He was still exercising, but reported some SOB with walking and talking. He had restarted lisinopril without orthostatic symptoms. I repeated an echocardiogram that showed moderate hypertrophy of the basal-septum, mild LVH, grade 1 DD, and normal RV.   He presents today for routine follow up.     Hypertension Orthostatic hypotension -5 mg lisinopril   Hyperlipidemia 11/19/2020: Cholesterol, Total 204; HDL 64; LDL Chol Calc (NIH) 123; Triglycerides 93 - on simvastatin and fish oil CVD risk estimated at 12%. Switched him to 20 mg crestor with good response:  11/23/2021: Cholesterol, Total 154; HDL 56; LDL Chol Calc (NIH) 79; Triglycerides 105         Past Medical History:  Diagnosis Date   Hyperlipemia    Hypertension    Plantar fasciitis, bilateral 1978    Past Surgical History:  Procedure Laterality Date   cataract surgery Bilateral 2019   COLONOSCOPY     HEEL SPUR SURGERY Bilateral [place].  Plantar fasciitis   rotator cuff surgery Right 10/27/2021   SHOULDER ARTHROSCOPY WITH BICEPSTENOTOMY Left 12/18/2012   Procedure: SHOULDER ARTHROSCOPY, SUBACROMIAL DECOMPRESSION, OPEN BICEPS TENOTOMY;  Surgeon: Rodney Sells, MD;  Location: [facility];  Service: Orthopedics;  Laterality: Left;   SHOULDER SURGERY  2012   right   TONSILLECTOMY     US ECHOCARDIOGRAPHY  04/28/2011   trace MR and TR; EF =>55%    Current Medications: No outpatient medications have been marked as taking for the 12/09/21 encounter (Appointment) with Rodney Thompson, [place].     Allergies:   Simcor [niacin-simvastatin er] and Vicodin [hydrocodone-acetaminophen]   Social History   Socioeconomic History   Marital status: Married    Spouse name: Not on file   Number of children: Not on file   Years of education: Not on file   Highest education level: Not on file  Occupational History   Not on file  Tobacco Use   Smoking status: Never   Smokeless tobacco: Never  Vaping Use   Vaping Use: Never used  Substance and Sexual Activity   Alcohol use: Yes    Alcohol/week: 14.0 standard drinks of alcohol    Types: 14 Standard drinks or equivalent per week   Drug use: Never   Sexual activity: Not on file  Other Topics Concern   Not on file  Social History Narrative   Not on file   Social Determinants of Health   Financial Resource Strain: Not on file  Food Insecurity: Not on file  Transportation Needs: Not on file  Physical Activity: Not on file  Stress: Not on file  Social Connections:  Not on file     Family History: The patient's ***family history includes CVA in his paternal grandfather; Cancer (age of onset: 16) in his father; Hypertension in his sister and sister; Hypertension (age of onset: 55) in his mother.  ROS:   Please see the history of present illness.    *** All other systems reviewed and are negative.  EKGs/Labs/Other Studies Reviewed:    The following studies were reviewed today:  Echo 01/30/21:  1. Left ventricular ejection fraction, by estimation, is 50 to 55%. The  left ventricle has low normal function. The left ventricle has no regional  wall motion abnormalities. There  is moderate hypertrophy of the  basal-septum. The rest of the LV segments  demonstrate mild concentric left ventricular hypertrophy. Left ventricular  diastolic parameters are consistent with Grade I diastolic dysfunction  (impaired relaxation).   2. Right ventricular systolic function is normal. The right ventricular  size is normal.   3. The mitral valve is normal in structure. Trivial mitral valve  regurgitation.   4. The aortic valve is tricuspid. There is mild calcification of the  aortic valve. There is mild thickening of the aortic valve. Aortic valve  regurgitation is not visualized.   EKG:  EKG is *** ordered today.  The ekg ordered today demonstrates ***  Recent Labs: 05/20/2021: Hemoglobin 17.7; Platelets 206 11/23/2021: ALT 41; BUN 17; Creatinine, Ser 1.08; Potassium 4.6; Sodium 138  Recent Lipid Panel    Component Value Date/Time   CHOL 154 11/23/2021 0912   TRIG 105 11/23/2021 0912   HDL 56 11/23/2021 0912   CHOLHDL 2.8 11/23/2021 0912   LDLCALC 79 11/23/2021 0912     Risk Assessment/Calculations:   {Does this patient have ATRIAL FIBRILLATION?[phone]}  No BP recorded.  {Refresh Note OR Click here to enter BP  :1}***         Physical Exam:    VS:  There were no vitals taken for this visit.    Wt Readings from Last 3 Encounters:  11/23/21 169 lb 9.6 oz (76.9 kg)  05/20/21 170 lb 12.8 oz (77.5 kg)  03/19/21 175 lb 9.6 oz (79.7 kg)     GEN: *** Well nourished, well developed in no acute distress HEENT: Normal NECK: No JVD; No carotid bruits LYMPHATICS: No lymphadenopathy CARDIAC: ***RRR, no murmurs, rubs, gallops RESPIRATORY:  Clear to auscultation without rales, wheezing or rhonchi  ABDOMEN: Soft, non-tender, non-distended MUSCULOSKELETAL:  No edema; No deformity  SKIN: Warm and dry NEUROLOGIC:  Alert and oriented x 3 PSYCHIATRIC:  Normal affect   ASSESSMENT:    No diagnosis found. PLAN:    In order of problems listed above:  ***       {Are you ordering a CV Procedure (e.g. stress test, cath, DCCV, TEE, etc)?   Press F2        :[id number]    Medication Adjustments/Labs and Tests Ordered: Current medicines are reviewed at length with the patient today.  Concerns regarding medicines are outlined above.  No orders of the defined types were placed in this encounter.  No orders of the defined types were placed in this encounter.   There are no Patient Instructions on file for this visit.   Signed, Rodney Bottcher, PA  11/26/2021 3:51 PM    [facility]

## 2021-12-09 ENCOUNTER — Ambulatory Visit: Payer: PRIVATE HEALTH INSURANCE | Admitting: Physician Assistant

## 2021-12-23 ENCOUNTER — Ambulatory Visit: Payer: PRIVATE HEALTH INSURANCE | Admitting: Physician Assistant

## 2021-12-24 NOTE — Progress Notes (Signed)
Cardiology Clinic Note   Patient Name: Rodney Thompson Date of Encounter: 12/25/2021  Primary Care Provider:  Glendale Chard, MD Primary Cardiologist:  Sanda Klein, MD  Patient Profile    Rodney Thompson 66 year old male presents to the clinic today for follow-up evaluation of his HTN and HLD.  Past Medical History    Past Medical History:  Diagnosis Date   Hyperlipemia    Hypertension    Plantar fasciitis, bilateral 1978   Past Surgical History:  Procedure Laterality Date   cataract surgery Bilateral 2019   COLONOSCOPY     HEEL SPUR SURGERY Bilateral 1978   Also 1979 and 1989.  Plantar fasciitis   rotator cuff surgery Right 10/27/2021   SHOULDER ARTHROSCOPY WITH BICEPSTENOTOMY Left 12/18/2012   Procedure: SHOULDER ARTHROSCOPY, SUBACROMIAL DECOMPRESSION, OPEN BICEPS TENOTOMY;  Surgeon: Nita Sells, MD;  Location: [facility];  Service: Orthopedics;  Laterality: Left;   SHOULDER SURGERY  2012   right   TONSILLECTOMY     US ECHOCARDIOGRAPHY  04/28/2011   trace MR and TR; EF =>55%    Allergies  Allergies  Allergen Reactions   Simcor [Niacin-Simvastatin Er] Other (See Comments)    flushing   Vicodin [Hydrocodone-Acetaminophen] Itching and Rash    History of Present Illness    Rodney Thompson is a PMH of orthostatic hypotension, HTN, hyperlipidemia, shortness of breath, and is overweight.  He is a retired Programme researcher, broadcasting/film/video.  He was seen by Rodney Ransom, PA-C on 04/19/2018.  During that time he reported postural dizziness and was orthostatic in the office.  His lisinopril was discontinued.  His stress testing in 2013 showed no ischemia.  He followed up with Rodney Adas PA-C on 01/13/2021.  During that time he was exercising 3 times per week, walk to golf courses 2 days/week.  However, did note some shortness of breath when walking and talking.  Patient denies shortness of breath with exercise.  He did note some shortness of breath with activities around his  house.  He had been restarted on 5 mg lisinopril.  At times it was felt that he was still having some positional dizziness but no presyncope or syncope.  Of note he was falling asleep very easily with sitting down on the couch at 4:30 in the afternoon.  He did note occasional snoring however not nightly.  There was not a high suspicion for OSA.  Sleep study was discussed.  Plan for his wife to track his snoring was made.  He presents to the clinic today for follow-up evaluation states feels well today.  He recently had right rotator cuff surgery.  He has been doing physical therapy 2 times per week and is slowly progressing his physical activity.  He has been walking 4 miles daily and denies increased shortness of breath or activity intolerance.  He eats most of his meals at home and reports medication compliance.  His blood pressure initially today is 130/95 and on recheck is 126/82.  His blood pressure is well controlled at home.  He reports that he continues to occasionally smoke at home.  We reviewed option for evaluation of OSA.  He wishes to defer at this time.  He had recent lab work drawn with his PCP 10/23.  We will plan follow-up in 12 months.  Today he denies chest pain, shortness of breath, lower extremity edema, fatigue, palpitations, melena, hematuria, hemoptysis, diaphoresis, weakness, presyncope, syncope, orthopnea, and PND.     Home Medications    Prior to  Admission medications   Medication Sig Start Date End Date Taking? Authorizing Provider  fish oil-omega-3 fatty acids 1000 MG capsule Take 1 g by mouth daily.    [provider]  lisinopril (ZESTRIL) 5 MG tablet TAKE 1 TABLET(5 MG) BY MOUTH DAILY 11/23/21   Glendale Chard, MD  rosuvastatin (CRESTOR) 20 MG tablet Take 1 tablet (20 mg total) by mouth daily. 11/23/21 11/18/22  Glendale Chard, MD    Family History    Family History  Problem Relation Age of Onset   Hypertension Mother 33   Cancer Father 17   CVA Paternal  Grandfather    Hypertension Sister    Hypertension Sister    He indicated that his mother is deceased. He indicated that his father is deceased. He indicated that all of his four sisters are alive. He indicated that his maternal grandmother is deceased. He indicated that his maternal grandfather is deceased. He indicated that his paternal grandmother is deceased. He indicated that his paternal grandfather is deceased.  Social History    Social History   Socioeconomic History   Marital status: Married    Spouse name: Not on file   Number of children: Not on file   Years of education: Not on file   Highest education level: Not on file  Occupational History   Not on file  Tobacco Use   Smoking status: Never   Smokeless tobacco: Never  Vaping Use   Vaping Use: Never used  Substance and Sexual Activity   Alcohol use: Yes    Alcohol/week: 14.0 standard drinks of alcohol    Types: 14 Standard drinks or equivalent per week   Drug use: Never   Sexual activity: Not on file  Other Topics Concern   Not on file  Social History Narrative   Not on file   Social Determinants of Health   Financial Resource Strain: Not on file  Food Insecurity: Not on file  Transportation Needs: Not on file  Physical Activity: Not on file  Stress: Not on file  Social Connections: Not on file  Intimate Partner Violence: Not on file     Review of Systems    General:  No chills, fever, night sweats or weight changes.  Cardiovascular:  No chest pain, dyspnea on exertion, edema, orthopnea, palpitations, paroxysmal nocturnal dyspnea. Dermatological: No rash, lesions/masses Respiratory: No cough, dyspnea Urologic: No hematuria, dysuria Abdominal:   No nausea, vomiting, diarrhea, bright red blood per rectum, melena, or hematemesis Neurologic:  No visual changes, wkns, changes in mental status. All other systems reviewed and are otherwise negative except as noted above.  Physical Exam    VS:  BP 126/82    Pulse 75   Ht 5' 7.5" (1.715 m)   Wt 171 lb 3.2 oz (77.7 kg)   SpO2 96%   BMI 26.42 kg/m  , BMI Body mass index is 26.42 kg/m. GEN: Well nourished, well developed, in no acute distress. HEENT: normal. Neck: Supple, no JVD, carotid bruits, or masses. Cardiac: RRR, no murmurs, rubs, or gallops. No clubbing, cyanosis, edema.  Radials/DP/PT 2+ and equal bilaterally.  Respiratory:  Respirations regular and unlabored, clear to auscultation bilaterally. GI: Soft, nontender, nondistended, BS + x 4. MS: no deformity or atrophy. Skin: warm and dry, no rash. Neuro:  Strength and sensation are intact. Psych: Normal affect.  Accessory Clinical Findings    Recent Labs: 05/20/2021: Hemoglobin 17.7; Platelets 206 11/23/2021: ALT 41; BUN 17; Creatinine, Ser 1.08; Potassium 4.6; Sodium 138  Recent Lipid Panel    Component Value Date/Time   CHOL 154 11/23/2021 0912   TRIG 105 11/23/2021 0912   HDL 56 11/23/2021 0912   CHOLHDL 2.8 11/23/2021 0912   LDLCALC 79 11/23/2021 0912         ECG personally reviewed by me today-none today  Echocardiogram 01/30/2021  IMPRESSIONS     1. Left ventricular ejection fraction, by estimation, is 50 to 55%. The  left ventricle has low normal function. The left ventricle has no regional  wall motion abnormalities. There is moderate hypertrophy of the  basal-septum. The rest of the LV segments  demonstrate mild concentric left ventricular hypertrophy. Left ventricular  diastolic parameters are consistent with Grade I diastolic dysfunction  (impaired relaxation).   2. Right ventricular systolic function is normal. The right ventricular  size is normal.   3. The mitral valve is normal in structure. Trivial mitral valve  regurgitation.   4. The aortic valve is tricuspid. There is mild calcification of the  aortic valve. There is mild thickening of the aortic valve. Aortic valve  regurgitation is not visualized.   Comparison(s): Compared to prior TTE  report in 2013, the LVEF now appears  low normal (50-55%). Previously reported as >55%.   FINDINGS   Left Ventricle: Left ventricular ejection fraction, by estimation, is 50  to 55%. The left ventricle has low normal function. The left ventricle has  no regional wall motion abnormalities. Global longitudinal strain  performed but not reported based on  interpreter judgement due to suboptimal tracking. The left ventricular  internal cavity size was normal in size. There is moderate hypertrophy of  the basal-septum. The rest of the LV segments demonstrate mild concentric  left ventricular hypertrophy. Left  ventricular diastolic parameters are consistent with Grade I diastolic  dysfunction (impaired relaxation).   Right Ventricle: The right ventricular size is normal. No increase in  right ventricular wall thickness. Right ventricular systolic function is  normal.   Left Atrium: Left atrial size was normal in size.   Right Atrium: Right atrial size was normal in size.   Pericardium: There is no evidence of pericardial effusion.   Mitral Valve: The mitral valve is normal in structure. There is mild  thickening of the mitral valve leaflet(s). There is mild calcification of  the mitral valve leaflet(s). Mild mitral annular calcification. Trivial  mitral valve regurgitation.   Tricuspid Valve: The tricuspid valve is normal in structure. Tricuspid  valve regurgitation is trivial.   Aortic Valve: The aortic valve is tricuspid. There is mild calcification  of the aortic valve. There is mild thickening of the aortic valve. Aortic  valve regurgitation is not visualized. Aortic valve mean gradient measures  2.0 mmHg. Aortic valve peak  gradient measures 4.1 mmHg. Aortic valve area, by VTI measures 4.08 cm.   Pulmonic Valve: The pulmonic valve was not well visualized. Pulmonic valve  regurgitation is trivial.   Aorta: The aortic root is normal in size and structure.   IAS/Shunts: No  atrial level shunt detected by color flow Doppler.  Assessment & Plan   1.  Dyspnea-none currently.  Continues to be very physically active.  Has been walking 5 miles per day post rotator cuff repair.  Continues to do physical therapy for his right shoulder 2 times per week.  Fatigue-  No increased fatigue or decreased endurance.  Recent lab work stable 10-23. Continue current diet and physical activity. Continue to monitor  Essential hypertension-BP today 126/82 Continue lisinopril  Heart healthy low-sodium diet-salty 6 given Increase physical activity as tolerated  Orthostatic hypotension-denies recent episodes of lightheadedness, presyncope or syncope. Maintain p.o. hydration Maintain physical activity  Hyperlipidemia-LDL 79 on 11/23/21 Continue rosuvastatin Heart healthy low-sodium diet-salty 6 given Increase physical activity as tolerated Follows with PCP  Disposition: Follow-up with Dr. Sallyanne Kuster or me in 12 months.   Jossie Ng. Garnie Borchardt NP-C     12/25/2021, 11:37 AM [place] [address] Office [phone] Fax [phone]    I spent 13 minutes examining this patient, reviewing medications, and using patient centered shared decision making involving her cardiac care.  Prior to her visit I spent greater than 20 minutes reviewing her past medical history,  medications, and prior cardiac tests.

## 2021-12-25 ENCOUNTER — Ambulatory Visit: Payer: Medicare Other | Attending: Cardiovascular Disease | Admitting: General Practice

## 2021-12-25 ENCOUNTER — Encounter: Payer: Self-pay | Admitting: General Practice

## 2021-12-25 VITALS — BP 126/82 | HR 75 | Ht 67.5 in | Wt 171.2 lb

## 2021-12-25 DIAGNOSIS — I10 Essential (primary) hypertension: Secondary | ICD-10-CM

## 2021-12-25 DIAGNOSIS — I951 Orthostatic hypotension: Secondary | ICD-10-CM | POA: Diagnosis present

## 2021-12-25 DIAGNOSIS — R5383 Other fatigue: Secondary | ICD-10-CM | POA: Diagnosis present

## 2021-12-25 DIAGNOSIS — E785 Hyperlipidemia, unspecified: Secondary | ICD-10-CM

## 2021-12-25 DIAGNOSIS — R0600 Dyspnea, unspecified: Secondary | ICD-10-CM | POA: Diagnosis present

## 2021-12-25 NOTE — Patient Instructions (Signed)
Medication Instructions:  The current medical regimen is effective;  continue present plan and medications as directed. Please refer to the Current Medication list given to you today.  *If you need a refill on your cardiac medications before your next appointment, please call your pharmacy*  Lab Work: NONE If you have labs (blood work) drawn today and your tests are completely normal, you will receive your results only by:  [place] (if you have MyChart) OR A paper copy in the mail  If you have any lab test that is abnormal or we need to change your treatment, we will call you to review the results.  Testing/Procedures: NONE  Follow-Up: At [facility], you and your health needs are our priority.  As part of our continuing mission to provide you with exceptional heart care, we have created designated Provider Care Teams.  These Care Teams include your primary Cardiologist (physician) and Advanced Practice Providers (APPs -  Physician Assistants and Nurse Practitioners) who all work together to provide you with the care you need, when you need it.  Your next appointment:   12 month(s)  The format for your next appointment:   In Person  Provider:   Sanda Klein, MD  or Coletta Memos, FNP       Important Information About [place]

## 2022-02-20 IMAGING — RF DG ESOPHAGUS
12 of 13 series · 22 of 24 positions shown · non-contrast
Comparison: NONE.

CLINICAL DATA: Shortness of breath while leaning forward.

EXAM:
ESOPHAGUS/BARIUM SWALLOW/TABLET STUDY
TECHNIQUE: Combined double and single contrast examination was performed using
effervescent crystals, high-density barium, and thin liquid barium.
This exam was performed by Quirijn Amazigh, and was supervised and
interpreted by Dr. Mikaelsen .
FLUOROSCOPY TIME:  Radiation Exposure Index (as provided by the
fluoroscopic device): 21.8 mGy
Fluoroscopy Time:  2 minute 36 seconds
Number of Acquired spot images: 0

[Series 1: cp_standard · 0.34mm/px · 2 of 52 frames shown (1 of 12)]
[frame 8/52]
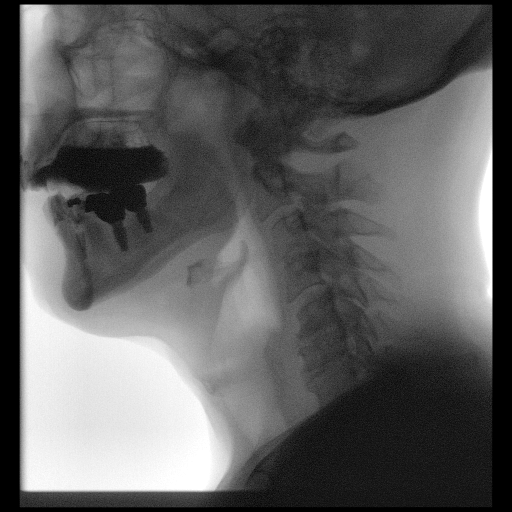
[frame 41/52]
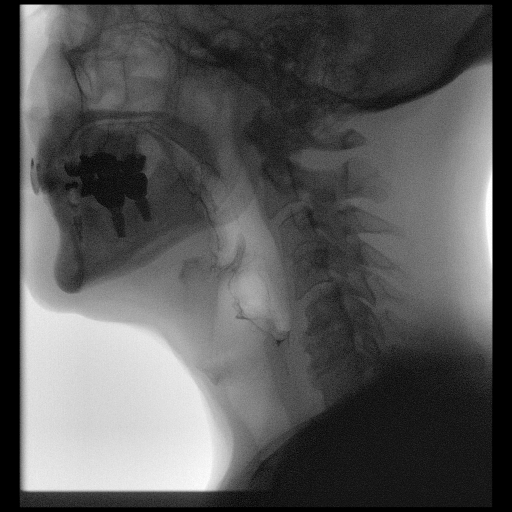

[Series 2: cp_standard · 0.34mm/px · 2 of 46 frames shown (2 of 12)]
[frame 7/46]
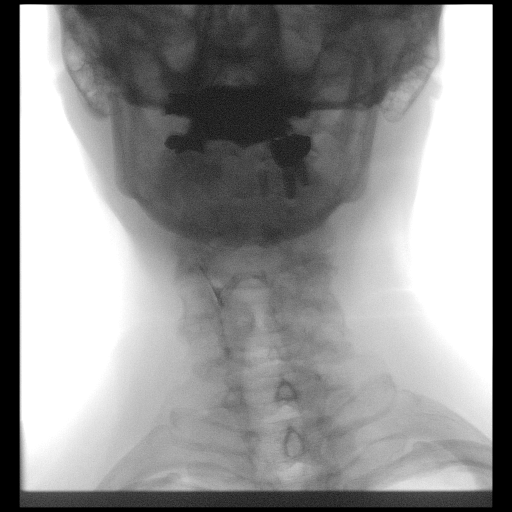
[frame 42/46]
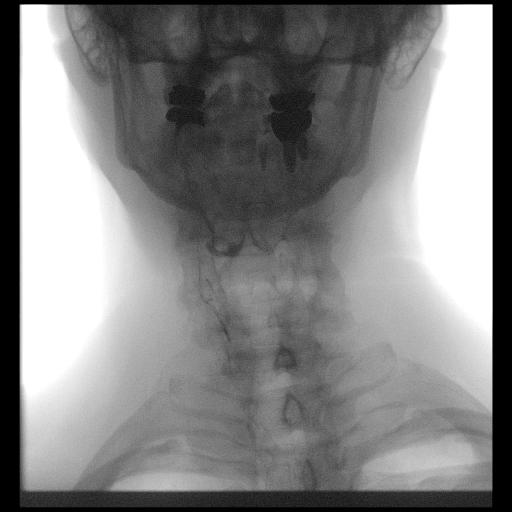

[Series 3: cp_standard · 0.51mm/px · 1 of 8 frames shown (3 of 12)]
[frame 5/8]
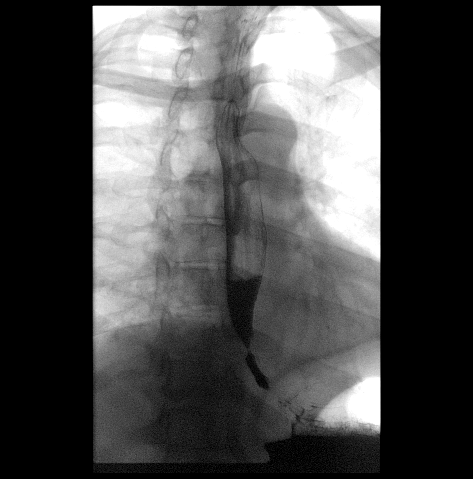

[Series 4: cp_standard · 0.51mm/px · 1 of 38 frames shown (4 of 12)]
[frame 20/38]
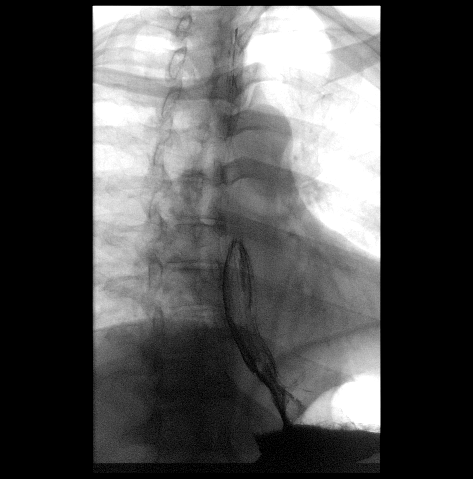

[Series 5: cp_standard · 0.51mm/px · 2 of 58 frames shown (5 of 12)]
[frame 7/58]
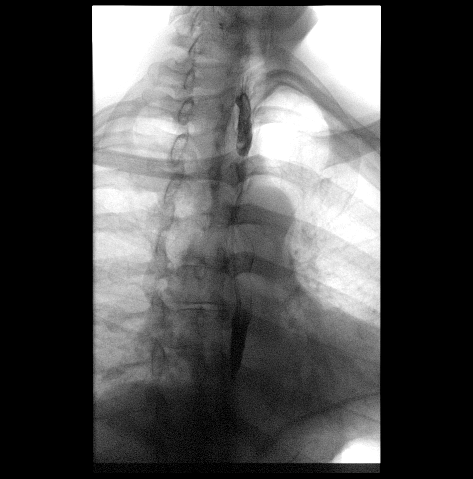
[frame 30/58]
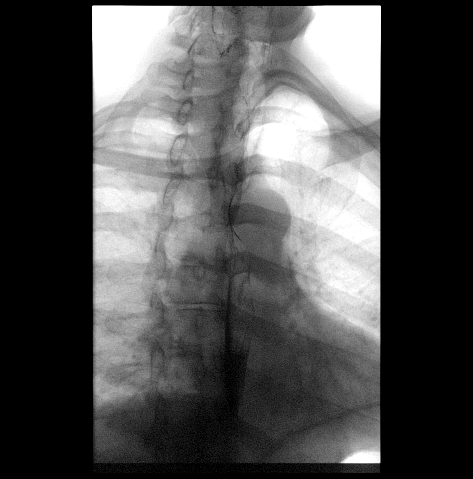

[Series 6: cp_standard · 0.34mm/px · 2 of 62 frames shown (6 of 12)]
[frame 10/62]
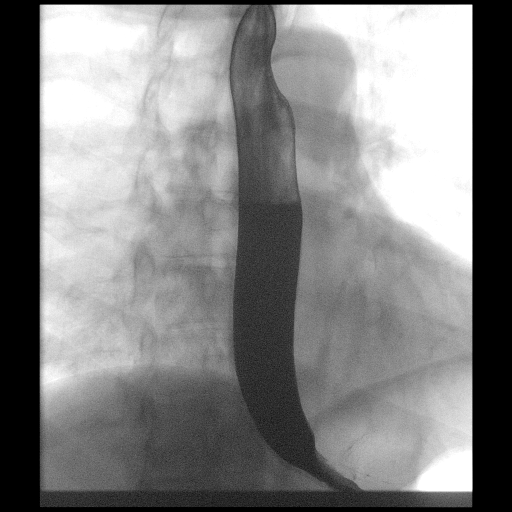
[frame 50/62]
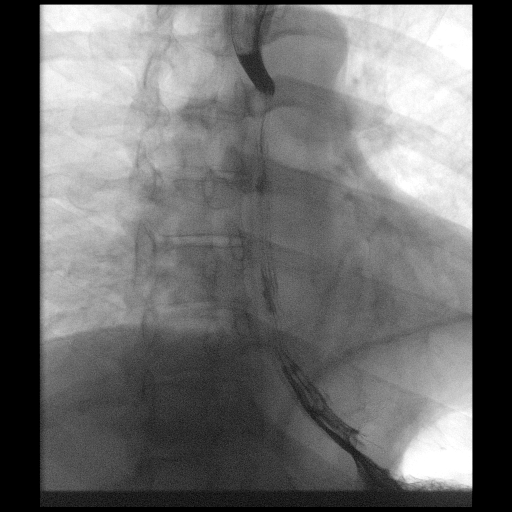

[Series 7: cp_standard · 0.51mm/px · 2 of 149 frames shown (7 of 12)]
[frame 23/149]
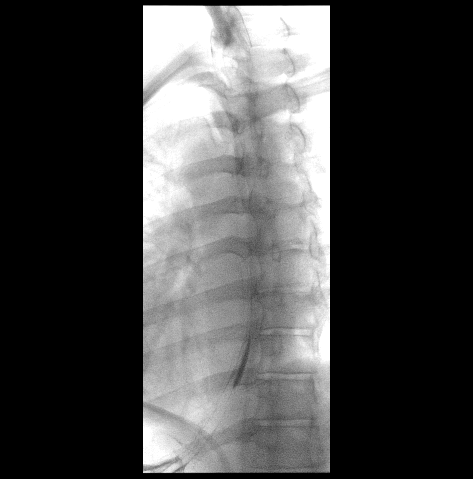
[frame 127/149]
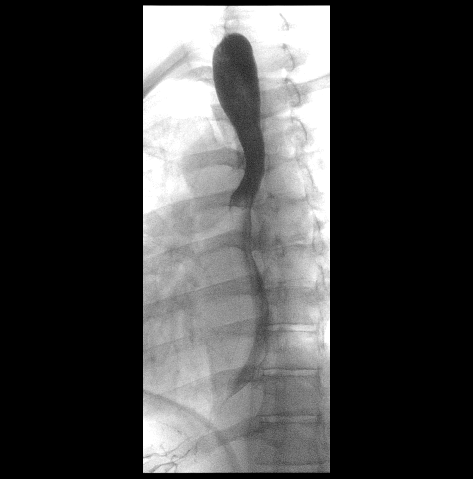

[Series 8: cp_standard · 0.51mm/px · 2 of 66 frames shown (8 of 12)]
[frame 34/66]
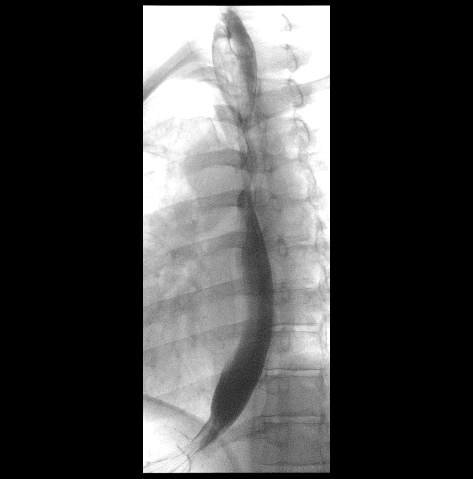
[frame 57/66]
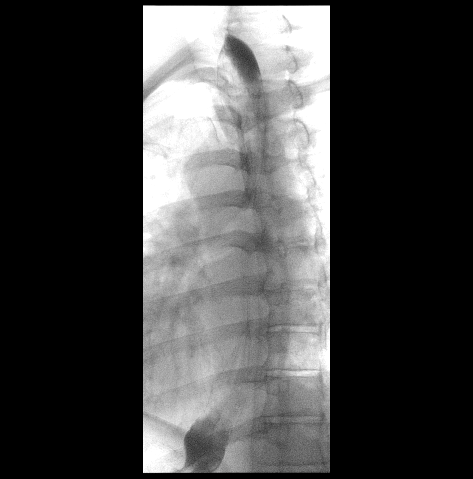

[Series 9: cp_standard · 0.51mm/px · 2 of 138 frames shown (9 of 12)]
[frame 70/138]
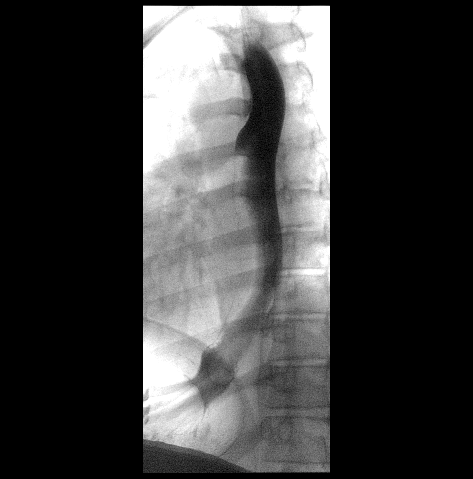
[frame 138/138]
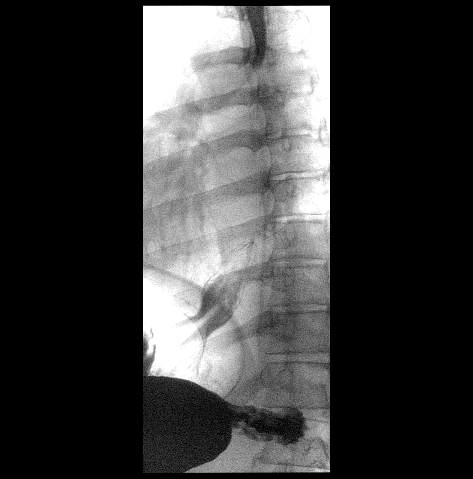

[Series 11: cp_standard · 0.51mm/px · 2 of 31 frames shown (10 of 12)]
[frame 5/31]
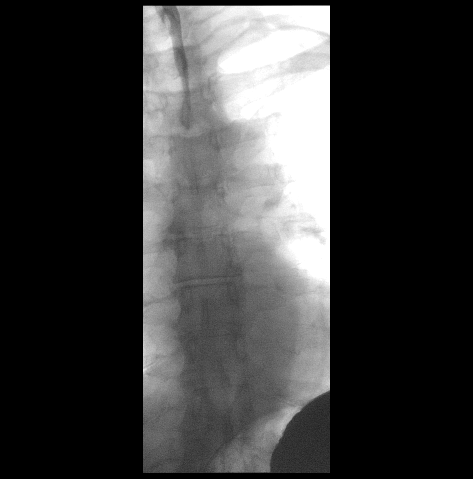
[frame 16/31]
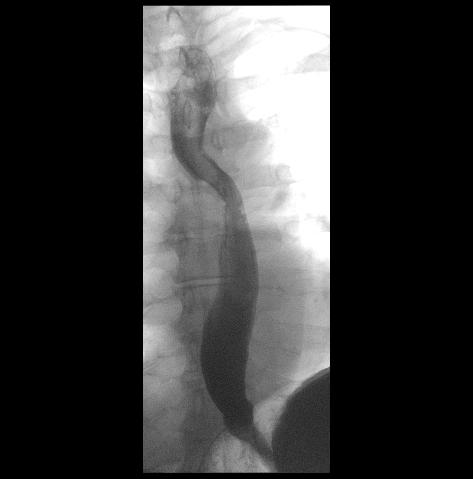

[Series 12: cp_standard · 0.51mm/px · 2 of 25 frames shown (11 of 12)]
[frame 4/25]
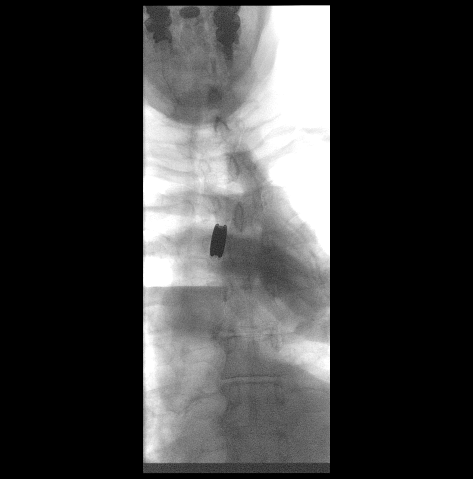
[frame 22/25]
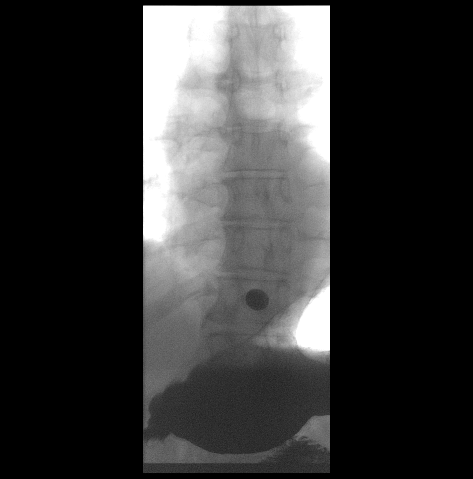

[Series 13: cp_standard · 0.51mm/px · 2 of 36 frames shown (12 of 12)]
[frame 19/36]
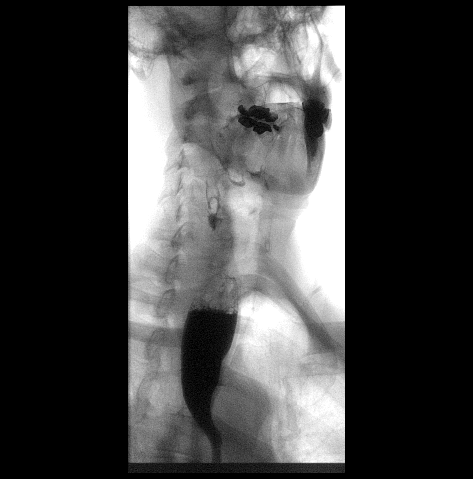
[frame 31/36]
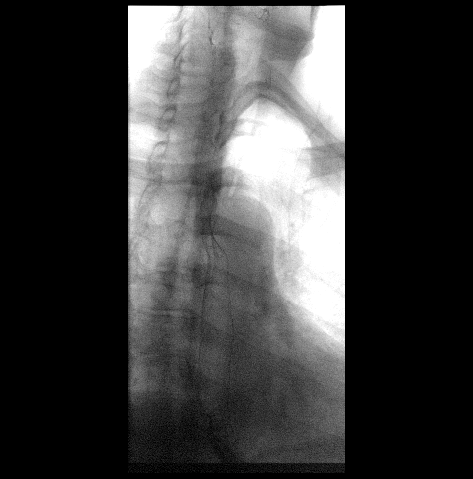

[22 of 24 positions shown; findings below may reference images not displayed]

FINDINGS: Swallowing: Appears normal. No vestibular penetration or aspiration
seen. Anterior osteophytes are present along the lower cervical
esophagus at C5-6 and C6-7 with mild impression upon the posterior
aspect of the cervical esophagus.

Pharynx: Unremarkable.

Esophagus: Normal appearance.

Esophageal motility: Normal appearance.

Hiatal Hernia: None.

Gastroesophageal reflux: None visualized.

Ingested 13mm barium tablet: Passed normally

Other: None.
IMPRESSION: Normal esophagram.

## 2022-03-03 ENCOUNTER — Encounter: Payer: Self-pay | Admitting: Internal Medicine

## 2022-03-03 ENCOUNTER — Telehealth: Payer: Self-pay | Admitting: *Deleted

## 2022-03-03 ENCOUNTER — Telehealth: Payer: Self-pay | Admitting: Cardiovascular Disease

## 2022-03-03 NOTE — Telephone Encounter (Signed)
  Patient Consent for Virtual Visit         Rodney Thompson has provided verbal consent on 03/03/2022 for a virtual visit (video or telephone).   CONSENT FOR VIRTUAL VISIT FOR:  Rodney Thompson  By participating in this virtual visit I agree to the following:  I hereby voluntarily request, consent and authorize [facility] and its employed or contracted physicians, physician assistants, nurse practitioners or other licensed health care professionals (the Practitioner), to provide me with telemedicine health care services (the "Services") as deemed necessary by the treating Practitioner. I acknowledge and consent to receive the Services by the Practitioner via telemedicine. I understand that the telemedicine visit will involve communicating with the Practitioner through live audiovisual communication technology and the disclosure of certain medical information by electronic transmission. I acknowledge that I have been given the opportunity to request an in-person assessment or other available alternative prior to the telemedicine visit and am voluntarily participating in the telemedicine visit.  I understand that I have the right to withhold or withdraw my consent to the use of telemedicine in the course of my care at any time, without affecting my right to future care or treatment, and that the Practitioner or I may terminate the telemedicine visit at any time. I understand that I have the right to inspect all information obtained and/or recorded in the course of the telemedicine visit and may receive copies of available information for a reasonable fee.  I understand that some of the potential risks of receiving the Services via telemedicine include:  Delay or interruption in medical evaluation due to technological equipment failure or disruption; Information transmitted may not be sufficient (e.g. poor resolution of images) to allow for appropriate medical decision making by the Practitioner;  and/or  In rare instances, security protocols could fail, causing a breach of personal health information.  Furthermore, I acknowledge that it is my responsibility to provide information about my medical history, conditions and care that is complete and accurate to the best of my ability. I acknowledge that Practitioner's advice, recommendations, and/or decision may be based on factors not within their control, such as incomplete or inaccurate data provided by me or distortions of diagnostic images or specimens that may result from electronic transmissions. I understand that the practice of medicine is not an exact science and that Practitioner makes no warranties or guarantees regarding treatment outcomes. I acknowledge that a copy of this consent can be made available to me via my patient portal ([facility]), or I can request a printed copy by calling the office of [facility].    I understand that my insurance will be billed for this visit.   I have read or had this consent read to me. I understand the contents of this consent, which adequately explains the benefits and risks of the Services being provided via telemedicine.  I have been provided ample opportunity to ask questions regarding this consent and the Services and have had my questions answered to my satisfaction. I give my informed consent for the services to be provided through the use of telemedicine in my medical care

## 2022-03-03 NOTE — Telephone Encounter (Signed)
   Name: Rodney Thompson  DOB: 20-May-1955  MRN: [id number]  Primary Cardiologist: Sanda Klein, MD  Chart reviewed as part of pre-operative protocol coverage. Because of Rodney Thompson's past medical history and time since last visit, he will require a follow-up telephone visit in order to better assess preoperative cardiovascular risk.  Pre-op covering staff: - Please schedule appointment and call patient to inform them. If patient already had an upcoming appointment within acceptable timeframe, please add "pre-op clearance" to the appointment notes so provider is aware. - Please contact requesting surgeon's office via preferred method (i.e, phone, fax) to inform them of need for appointment prior to surgery.  No medications need held from our perspective.  However, may consider holding the fish oil x 5 days due to its antiplatelet qualities.  Elgie Collard, PA-C  03/03/2022, 11:45 AM

## 2022-03-03 NOTE — Telephone Encounter (Signed)
   Pre-operative Risk Assessment    Patient Name: Rodney Thompson  DOB: 02/12/1956 MRN: [id number]      Request for Surgical Clearance    Procedure:  revision of right shoulder rotator cuff repair  Date of Surgery:  Clearance TBD, wants it done as soon as possible                          Surgeon:  Dr. Jesusita Oka Group or Practice Name:  [facility]  Phone number:  [phone] Fax number:  [phone], [phone]   Type of Clearance Requested:   - Medical  - Pharmacy:  requesting cardiac recommendation if anything needs to be held   Type of Anesthesia:  Choice   Additional requests/questions:    Signed, Annabell Sabal   03/03/2022, 10:21 AM

## 2022-03-07 NOTE — Progress Notes (Unsigned)
Virtual Visit via Telephone Note   Because of Rodney Thompson's co-morbid illnesses, he is at least at moderate risk for complications without adequate follow up.  This format is felt to be most appropriate for this patient at this time.  The patient did not have access to video technology/had technical difficulties with video requiring transitioning to audio format only (telephone).  All issues noted in this document were discussed and addressed.  No physical exam could be performed with this format.  Please refer to the patient's chart for his consent to telehealth for [facility].  Evaluation Performed:  Preoperative cardiovascular risk assessment _____________   Date:  03/07/2022   Patient ID:  Rodney Thompson, DOB 1955-06-24, MRN [id number] Patient Location:  Home Provider location:   Office  Primary Care Provider:  Glendale Chard, MD Primary Cardiologist:  Sanda Klein, MD  Chief Complaint / Patient Profile   67 y.o. y/o male with a h/o orthostatic hypotension, HTN, hyperlipidemia, shortness of breath, and is overweight  who is pending right rotator cuff revision repair.  And presents today for telephonic preoperative cardiovascular risk assessment.  History of Present Illness    Rodney Thompson is a 67 y.o. male who presents via audio/video conferencing for a telehealth visit today.  Pt was last seen in cardiology clinic on 12/25/2021 by Coletta Memos, NP.  At that time Rodney Thompson was doing well with no new cardiac complaints at that time..  The patient is now pending procedure as outlined above. Since his last visit, he ***   No medications need held from our perspective.  However, may consider holding the fish oil x 5 days due to its antiplatelet qualities.   Past Medical History    Past Medical History:  Diagnosis Date   Hyperlipemia    Hypertension    Plantar fasciitis, bilateral 1978   Past Surgical History:  Procedure Laterality Date   cataract surgery  Bilateral 2019   COLONOSCOPY     HEEL SPUR SURGERY Bilateral 1978   Also 1979 and 1989.  Plantar fasciitis   rotator cuff surgery Right 10/27/2021   SHOULDER ARTHROSCOPY WITH BICEPSTENOTOMY Left 12/18/2012   Procedure: SHOULDER ARTHROSCOPY, SUBACROMIAL DECOMPRESSION, OPEN BICEPS TENOTOMY;  Surgeon: Nita Sells, MD;  Location: [facility];  Service: Orthopedics;  Laterality: Left;   SHOULDER SURGERY  2012   right   TONSILLECTOMY     US ECHOCARDIOGRAPHY  04/28/2011   trace MR and TR; EF =>55%    Allergies  Allergies  Allergen Reactions   Simcor [Niacin-Simvastatin Er] Other (See Comments)    flushing   Vicodin [Hydrocodone-Acetaminophen] Itching and Rash    Home Medications    Prior to Admission medications   Medication Sig Start Date End Date Taking? Authorizing Provider  fish oil-omega-3 fatty acids 1000 MG capsule Take 1 g by mouth daily.    [provider]  lisinopril (ZESTRIL) 5 MG tablet TAKE 1 TABLET(5 MG) BY MOUTH DAILY 11/23/21   Glendale Chard, MD  rosuvastatin (CRESTOR) 20 MG tablet Take 1 tablet (20 mg total) by mouth daily. 11/23/21 11/18/22  Glendale Chard, MD    Physical Exam    Vital Signs:  Rodney Thompson does not have vital signs available for review today.***  Given telephonic nature of communication, physical exam is limited. AAOx3. NAD. Normal affect.  Speech and respirations are unlabored.  Accessory Clinical Findings    None  Assessment & Plan    1.  Preoperative Cardiovascular Risk Assessment: -{Click  Here to Calculate RCRI      :287867672}  { Click Here to Calculate DASI      :094709628} {Select to add RCRI Risk (<1%=LOW; >/=1%=HIGH) (Optional):21036017}  {Select if HIGH (RCRI >/=1%) Risk (Optional):21036030} Recommendations: {2014 ACC/AHA Perioperative Guidelines  :21036001} Antiplatelet and/or Anticoagulation Recommendations: {Antiplatelet Recommendations                  :21036016} {Anticoagulation  Recommendations           :36629476}   The patient was advised that if he develops new symptoms prior to surgery to contact our office to arrange for a follow-up visit, and he verbalized understanding.  (Reminder: Include SBE prophylaxis/Antiplatelet/Anticoag Instructions***)  A copy of this note will be routed to requesting surgeon.  Time:   Today, I have spent *** minutes with the patient with telehealth technology discussing medical history, symptoms, and management plan.     Mable Fill, Marissa Nestle, NP  03/07/2022, 9:08 PM

## 2022-03-08 ENCOUNTER — Ambulatory Visit: Payer: Medicare Other | Attending: Internal Medicine | Admitting: Nurse Practitioner

## 2022-03-08 DIAGNOSIS — Z01810 Encounter for preprocedural cardiovascular examination: Secondary | ICD-10-CM

## 2022-03-11 ENCOUNTER — Telehealth: Payer: Self-pay

## 2022-03-11 NOTE — Telephone Encounter (Signed)
Patient walked in office stated Dr.Chandler never received surgical clearance.Rodney Thompson 03/08/22 office note clearing him for upcoming surgery given to patient to take to Dr.Chandler's office.

## 2022-03-25 HISTORY — PX: OTHER SURGICAL HISTORY: SHX169

## 2022-05-04 ENCOUNTER — Ambulatory Visit (INDEPENDENT_AMBULATORY_CARE_PROVIDER_SITE_OTHER): Payer: Medicare Other | Admitting: Internal Medicine

## 2022-05-04 ENCOUNTER — Encounter: Payer: Self-pay | Admitting: Internal Medicine

## 2022-05-04 VITALS — BP 130/84 | HR 81 | Temp 97.6°F | Ht 67.0 in | Wt 174.6 lb

## 2022-05-04 DIAGNOSIS — I10 Essential (primary) hypertension: Secondary | ICD-10-CM | POA: Diagnosis not present

## 2022-05-04 DIAGNOSIS — J0100 Acute maxillary sinusitis, unspecified: Secondary | ICD-10-CM | POA: Diagnosis not present

## 2022-05-04 MED ORDER — AMOXICILLIN-POT CLAVULANATE 875-125 MG PO TABS: 1.0000 | ORAL_TABLET | Freq: Two times a day (BID) | ORAL | 0 refills | Status: DC

## 2022-05-04 NOTE — Patient Instructions (Signed)
Postnasal Drip Postnasal drip is the feeling of mucus going down the back of your throat. Mucus is a slimy substance that moistens and cleans your nose and throat, as well as the air pockets in face bones near your forehead and cheeks (sinuses). Small amounts of mucus pass from your nose and sinuses down the back of your throat all the time. This is normal. When you produce too much mucus or the mucus gets too thick, you can feel it. Some common causes of postnasal drip include: Having more mucus because of: A cold or the flu. Allergies. Cold air. Certain medicines. Gastroesophageal reflux. Having more mucus that is thicker because of: A sinus or nasal infection. Dry air. A food allergy. Follow these instructions at home: Relieving discomfort  Gargle with a mixture of salt and water 3-4 times a day or as needed. To make salt water, completely dissolve -1 tsp (3-6 g) of salt in 1 cup (237 mL) of warm water. If the air in your home is dry, use a humidifier to add moisture to the air. Use a saline spray or a container (neti pot) to flush out the nose (nasal irrigation). These methods can help clear away mucus and keep the nasal passages moist. General instructions Take over-the-counter and prescription medicines only as told by your health care provider. Follow instructions from your health care provider about eating or drinking restrictions. You may need to avoid caffeine. Avoid things that you know you are allergic to (allergens), like dust, mold, pollen, pets, or certain foods. Drink enough fluid to keep your urine pale yellow. Keep all follow-up visits. This is important. Contact a health care provider if: You have a fever. You have a sore throat or difficulty swallowing. You have a headache. You have sinus or ear pain. You have a cough that does not go away. The mucus from your nose becomes thick and is green or yellow in color. You have cold or flu symptoms that last more than 10  days. Summary Postnasal drip is the feeling of mucus going down the back of your throat. Use nasal irrigation or a nasal spray to help clear away mucus and keep the nasal passages moist. Avoid things that you know you are allergic to (allergens), like dust, mold, pollen, pets, or certain foods. This information is not intended to replace advice given to you by your health care provider. Make sure you discuss any questions you have with your health care provider. Document Revised: 01/08/2021 Document Reviewed: 01/08/2021 Elsevier Patient Education  2023 Elsevier Inc.  

## 2022-05-04 NOTE — Progress Notes (Signed)
I,Victoria T Hamilton,acting as a scribe for Maximino Greenland, MD.,have documented all relevant documentation on the behalf of Maximino Greenland, MD,as directed by  Maximino Greenland, MD while in the presence of Maximino Greenland, MD.    Subjective:     Patient ID: Rodney Thompson , male    DOB: 06/02/1955 , 67 y.o.   MRN: [id number]   Chief Complaint  Patient presents with   Nasal Congestion    HPI  Patient presents today for nasal congestion. He reports having a bad cold last week. He did a at home last Wednesday covid test, negative result. His wife had COVID about a month ago.   He reports he did run a fever last Wednesday. Temp: 101   At home he has used Nyquil, Advil cold & sinus w/o relief of his sx.        Past Medical History:  Diagnosis Date   Hyperlipemia    Hypertension    Plantar fasciitis, bilateral 1978     Family History  Problem Relation Age of Onset   Hypertension Mother 95   Cancer Father 21   CVA Paternal Grandfather    Hypertension Sister    Hypertension Sister      Current Outpatient Medications:    amoxicillin-clavulanate (AUGMENTIN) 875-125 MG tablet, Take 1 tablet by mouth 2 (two) times daily., Disp: 20 tablet, Rfl: 0   fish oil-omega-3 fatty acids 1000 MG capsule, Take 1 g by mouth daily., Disp: , Rfl:    lisinopril (ZESTRIL) 5 MG tablet, TAKE 1 TABLET(5 MG) BY MOUTH DAILY, Disp: 90 tablet, Rfl: 2   rosuvastatin (CRESTOR) 20 MG tablet, Take 1 tablet (20 mg total) by mouth daily., Disp: 90 tablet, Rfl: 3   Allergies  Allergen Reactions   Simcor [Niacin-Simvastatin Er] Other (See Comments)    flushing   Vicodin [Hydrocodone-Acetaminophen] Itching and Rash     Review of Systems  Constitutional: Negative.   HENT:  Positive for congestion.   Respiratory:  Positive for cough.   Cardiovascular: Negative.   Gastrointestinal: Negative.   Skin: Negative.   Allergic/Immunologic: Negative.   Neurological: Negative.   Hematological: Negative.    Psychiatric/Behavioral: Negative.       Today's Vitals   05/04/22 0919  BP: 130/84  Pulse: 81  Temp: 97.6 F (36.4 C)  SpO2: 98%  Weight: 174 lb 9.6 oz (79.2 kg)  Height: 5\' 7"  (1.702 m)   Body mass index is 27.35 kg/m.  Wt Readings from Last 3 Encounters:  05/04/22 174 lb 9.6 oz (79.2 kg)  12/25/21 171 lb 3.2 oz (77.7 kg)  11/23/21 169 lb 9.6 oz (76.9 kg)    Objective:  Physical Exam Vitals and nursing note reviewed.  Constitutional:      Appearance: Normal appearance.  HENT:     Head: Normocephalic and atraumatic.     Right Ear: Tympanic membrane, ear canal and external ear normal. There is no impacted cerumen.     Left Ear: Tympanic membrane, ear canal and external ear normal. There is no impacted cerumen.     Nose:     Comments: Maxillary tenderness to percussion Eyes:     Extraocular Movements: Extraocular movements intact.  Cardiovascular:     Rate and Rhythm: Normal rate and regular rhythm.     Heart sounds: Normal heart sounds.  Pulmonary:     Effort: Pulmonary effort is normal.     Breath sounds: Normal breath sounds.  Musculoskeletal:     Cervical  back: Normal range of motion.  Skin:    General: Skin is warm.  Neurological:     General: No focal deficit present.     Mental Status: He is alert.  Psychiatric:        Mood and Affect: Mood normal.         Assessment And Plan:     1. Acute non-recurrent maxillary sinusitis Comments: I will send rx Augmentin, encouraged to complete full abx course. Advised to avoid dairy while he is having sx. Med allergies reviewed.  2. Essential hypertension, benign Comments: Chronic, controlled. He should avoid OTC decongestants.     Patient was given opportunity to ask questions. Patient verbalized understanding of the plan and was able to repeat key elements of the plan. All questions were answered to their satisfaction.   I, Maximino Greenland, MD, have reviewed all documentation for this visit. The  documentation on 05/04/22 for the exam, diagnosis, procedures, and orders are all accurate and complete.   IF YOU HAVE BEEN REFERRED TO A SPECIALIST, IT MAY TAKE 1-2 WEEKS TO SCHEDULE/PROCESS THE REFERRAL. IF YOU HAVE NOT HEARD FROM US/SPECIALIST IN TWO WEEKS, PLEASE GIVE [place] A CALL AT [phone].   THE PATIENT IS ENCOURAGED TO PRACTICE SOCIAL DISTANCING DUE TO THE COVID-19 PANDEMIC.

## 2022-06-16 ENCOUNTER — Ambulatory Visit (INDEPENDENT_AMBULATORY_CARE_PROVIDER_SITE_OTHER): Payer: Medicare Other | Admitting: Internal Medicine

## 2022-06-16 ENCOUNTER — Encounter: Payer: Self-pay | Admitting: Internal Medicine

## 2022-06-16 VITALS — BP 118/82 | HR 81 | Temp 97.7°F | Ht 67.0 in | Wt 174.4 lb

## 2022-06-16 DIAGNOSIS — I10 Essential (primary) hypertension: Secondary | ICD-10-CM | POA: Diagnosis not present

## 2022-06-16 DIAGNOSIS — Z125 Encounter for screening for malignant neoplasm of prostate: Secondary | ICD-10-CM

## 2022-06-16 DIAGNOSIS — Z6827 Body mass index (BMI) 27.0-27.9, adult: Secondary | ICD-10-CM

## 2022-06-16 DIAGNOSIS — E7800 Pure hypercholesterolemia, unspecified: Secondary | ICD-10-CM | POA: Diagnosis not present

## 2022-06-16 DIAGNOSIS — Z0000 Encounter for general adult medical examination without abnormal findings: Secondary | ICD-10-CM

## 2022-06-16 LAB — POCT URINALYSIS DIPSTICK
Bilirubin, UA: NEGATIVE
Glucose, UA: NEGATIVE
Ketones, UA: NEGATIVE
Leukocytes, UA: NEGATIVE
Nitrite, UA: NEGATIVE
Protein, UA: POSITIVE — AB
Spec Grav, UA: 1.025 (ref 1.010–1.025)
Urobilinogen, UA: 0.2 E.U./dL
pH, UA: 5.5 (ref 5.0–8.0)

## 2022-06-16 NOTE — Patient Instructions (Addendum)
Rodney Thompson , Thank you for taking time to come for your Medicare Wellness Visit. I appreciate your ongoing commitment to your health goals. Please review the following plan we discussed and let me know if I can assist you in the future.   These are the goals we discussed:  Goals      Weight loss     Patient plans to lose weight. His goal weight : [id number].         This is a list of the screening recommended for you and due dates:  Health Maintenance  Topic Date Due   COVID-19 Vaccine (7 - 2023-24 season) 02/01/2022   Pneumonia Vaccine (2 of 2 - PPSV23 or PCV20) 05/04/2023*   Flu Shot  09/23/2022   Medicare Annual Wellness Visit  06/16/2023   Colon Cancer Screening  05/28/2031   Hepatitis C Screening: USPSTF Recommendation to screen - Ages 18-79 yo.  Completed   HIV Screening  Completed   Zoster (Shingles) Vaccine  Completed   HPV Vaccine  Aged Out   DTaP/Tdap/Td vaccine  Discontinued  *Topic was postponed. The date shown is not the original due date.     Coronary Calcium Scan A coronary calcium scan is an imaging test used to look for deposits of plaque in the inner lining of the blood vessels of the heart (coronary arteries). Plaque is made up of calcium, protein, and fatty substances. These deposits of plaque can partly clog and narrow the coronary arteries without producing any symptoms or warning signs. This puts a person at risk for a heart attack. A coronary calcium scan is performed using a computed tomography (CT) scanner machine without using a dye (contrast). This test is recommended for people who are at moderate risk for heart disease. The test can find plaque deposits before symptoms develop. Tell a health care provider about: Any allergies you have. All medicines you are taking, including vitamins, herbs, eye drops, creams, and over-the-counter medicines. Any problems you or family members have had with anesthetic medicines. Any bleeding problems you have. Any  surgeries you have had. Any medical conditions you have. Whether you are pregnant or may be pregnant. What are the risks? Generally, this is a safe procedure. However, problems may occur, including: Harm to a pregnant woman and her unborn baby. This test involves the use of radiation. Radiation exposure can be dangerous to a pregnant woman and her unborn baby. If you are pregnant or think you may be pregnant, you should not have this procedure done. A slight increase in the risk of cancer. This is because of the radiation involved in the test. The amount of radiation from one test is similar to the amount of radiation you are naturally exposed to over one year. What happens before the procedure? Ask your health care provider for any specific instructions on how to prepare for this procedure. You may be asked to avoid products that contain caffeine, tobacco, or nicotine for 4 hours before the procedure. What happens during the procedure?  You will undress and remove any jewelry from your neck or chest. You may need to remove hearing aides and dentures. Women may need to remove their bras. You will put on a hospital gown. Sticky electrodes will be placed on your chest. The electrodes will be connected to an electrocardiogram (ECG) machine to record a tracing of the electrical activity of your heart. You will lie down on your back on a curved bed that is attached to the CT  scanner. You may be given medicine to slow down your heart rate so that clear pictures can be created. You will be moved into the CT scanner, and the CT scanner will take pictures of your heart. During this time, you will be asked to lie still and hold your breath for 10-20 seconds at a time while each picture of your heart is being taken. The procedure may vary among health care providers and hospitals. What can I expect after the procedure? You can return to your normal activities. It is up to you to get the results of your  procedure. Ask your health care provider, or the department that is doing the procedure, when your results will be ready. Summary A coronary calcium scan is an imaging test used to look for deposits of plaque in the inner lining of the blood vessels of the heart. Plaque is made up of calcium, protein, and fatty substances. A coronary calcium scan is performed using a CT scanner machine without contrast. Generally, this is a safe procedure. Tell your health care provider if you are pregnant or may be pregnant. Ask your health care provider for any specific instructions on how to prepare for this procedure. You can return to your normal activities after the scan is done. This information is not intended to replace advice given to you by your health care provider. Make sure you discuss any questions you have with your health care provider. Document Revised: 01/18/2021 Document Reviewed: 01/18/2021 Elsevier Patient Education  2023 [employer]   Health Maintenance, Male Adopting a healthy lifestyle and getting preventive care are important in promoting health and wellness. Ask your health care provider about: The right schedule for you to have regular tests and exams. Things you can do on your own to prevent diseases and keep yourself healthy. What should I know about diet, weight, and exercise? Eat a healthy diet  Eat a diet that includes plenty of vegetables, fruits, low-fat dairy products, and lean protein. Do not eat a lot of foods that are high in solid fats, added sugars, or sodium. Maintain a healthy weight Body mass index (BMI) is a measurement that can be used to identify possible weight problems. It estimates body fat based on height and weight. Your health care provider can help determine your BMI and help you achieve or maintain a healthy weight. Get regular exercise Get regular exercise. This is one of the most important things you can do for your health. Most adults  should: Exercise for at least 150 minutes each week. The exercise should increase your heart rate and make you sweat (moderate-intensity exercise). Do strengthening exercises at least twice a week. This is in addition to the moderate-intensity exercise. Spend less time sitting. Even light physical activity can be beneficial. Watch cholesterol and blood lipids Have your blood tested for lipids and cholesterol at 66 years of age, then have this test every 5 years. You may need to have your cholesterol levels checked more often if: Your lipid or cholesterol levels are high. You are older than 66 years of age. You are at high risk for heart disease. What should I know about cancer screening? Many types of cancers can be detected early and may often be prevented. Depending on your health history and family history, you may need to have cancer screening at various ages. This may include screening for: Colorectal cancer. Prostate cancer. Skin cancer. Lung cancer. What should I know about heart disease, diabetes, and high blood pressure?  Blood pressure and heart disease High blood pressure causes heart disease and increases the risk of stroke. This is more likely to develop in people who have high blood pressure readings or are overweight. Talk with your health care provider about your target blood pressure readings. Have your blood pressure checked: Every 3-5 years if you are 48-48 years of age. Every year if you are 52 years old or older. If you are between the ages of 41 and 35 and are a current or former smoker, ask your health care provider if you should have a one-time screening for abdominal aortic aneurysm (AAA). Diabetes Have regular diabetes screenings. This checks your fasting blood sugar level. Have the screening done: Once every three years after age 24 if you are at a normal weight and have a low risk for diabetes. More often and at a younger age if you are overweight or have a high  risk for diabetes. What should I know about preventing infection? Hepatitis B If you have a higher risk for hepatitis B, you should be screened for this virus. Talk with your health care provider to find out if you are at risk for hepatitis B infection. Hepatitis C Blood testing is recommended for: Everyone born from [birth year] through 1964. Anyone with known risk factors for hepatitis C. Sexually transmitted infections (STIs) You should be screened each year for STIs, including gonorrhea and chlamydia, if: You are sexually active and are younger than 66 years of age. You are older than 66 years of age and your health care provider tells you that you are at risk for this type of infection. Your sexual activity has changed since you were last screened, and you are at increased risk for chlamydia or gonorrhea. Ask your health care provider if you are at risk. Ask your health care provider about whether you are at high risk for HIV. Your health care provider may recommend a prescription medicine to help prevent HIV infection. If you choose to take medicine to prevent HIV, you should first get tested for HIV. You should then be tested every 3 months for as long as you are taking the medicine. Follow these instructions at home: Alcohol use Do not drink alcohol if your health care provider tells you not to drink. If you drink alcohol: Limit how much you have to 0-2 drinks a day. Know how much alcohol is in your drink. In the [place] one drink equals one 12 oz bottle of beer (355 mL), one 5 oz glass of wine (148 mL), or one 1 oz glass of hard liquor (44 mL). Lifestyle Do not use any products that contain nicotine or tobacco. These products include cigarettes, chewing tobacco, and vaping devices, such as e-cigarettes. If you need help quitting, ask your health care provider. Do not use street drugs. Do not share needles. Ask your health care provider for help if you need support or information about  quitting drugs. General instructions Schedule regular health, dental, and eye exams. Stay current with your vaccines. Tell your health care provider if: You often feel depressed. You have ever been abused or do not feel safe at home. Summary Adopting a healthy lifestyle and getting preventive care are important in promoting health and wellness. Follow your health care provider's instructions about healthy diet, exercising, and getting tested or screened for diseases. Follow your health care provider's instructions on monitoring your cholesterol and blood pressure. This information is not intended to replace advice given to you by your health  care provider. Make sure you discuss any questions you have with your health care provider. Document Revised: 06/30/2020 Document Reviewed: 06/30/2020 Elsevier Patient Education  2023 [employer]  Health Maintenance, Male Adopting a healthy lifestyle and getting preventive care are important in promoting health and wellness. Ask your health care provider about: The right schedule for you to have regular tests and exams. Things you can do on your own to prevent diseases and keep yourself healthy. What should I know about diet, weight, and exercise? Eat a healthy diet  Eat a diet that includes plenty of vegetables, fruits, low-fat dairy products, and lean protein. Do not eat a lot of foods that are high in solid fats, added sugars, or sodium. Maintain a healthy weight Body mass index (BMI) is a measurement that can be used to identify possible weight problems. It estimates body fat based on height and weight. Your health care provider can help determine your BMI and help you achieve or maintain a healthy weight. Get regular exercise Get regular exercise. This is one of the most important things you can do for your health. Most adults should: Exercise for at least 150 minutes each week. The exercise should increase your heart rate and make you sweat  (moderate-intensity exercise). Do strengthening exercises at least twice a week. This is in addition to the moderate-intensity exercise. Spend less time sitting. Even light physical activity can be beneficial. Watch cholesterol and blood lipids Have your blood tested for lipids and cholesterol at 66 years of age, then have this test every 5 years. You may need to have your cholesterol levels checked more often if: Your lipid or cholesterol levels are high. You are older than 66 years of age. You are at high risk for heart disease. What should I know about cancer screening? Many types of cancers can be detected early and may often be prevented. Depending on your health history and family history, you may need to have cancer screening at various ages. This may include screening for: Colorectal cancer. Prostate cancer. Skin cancer. Lung cancer. What should I know about heart disease, diabetes, and high blood pressure? Blood pressure and heart disease High blood pressure causes heart disease and increases the risk of stroke. This is more likely to develop in people who have high blood pressure readings or are overweight. Talk with your health care provider about your target blood pressure readings. Have your blood pressure checked: Every 3-5 years if you are 1-78 years of age. Every year if you are 110 years old or older. If you are between the ages of 54 and 40 and are a current or former smoker, ask your health care provider if you should have a one-time screening for abdominal aortic aneurysm (AAA). Diabetes Have regular diabetes screenings. This checks your fasting blood sugar level. Have the screening done: Once every three years after age 37 if you are at a normal weight and have a low risk for diabetes. More often and at a younger age if you are overweight or have a high risk for diabetes. What should I know about preventing infection? Hepatitis B If you have a higher risk for  hepatitis B, you should be screened for this virus. Talk with your health care provider to find out if you are at risk for hepatitis B infection. Hepatitis C Blood testing is recommended for: Everyone born from [birth year] through 1964. Anyone with known risk factors for hepatitis C. Sexually transmitted infections (STIs) You should be screened each  year for STIs, including gonorrhea and chlamydia, if: You are sexually active and are younger than 67 years of age. You are older than 66 years of age and your health care provider tells you that you are at risk for this type of infection. Your sexual activity has changed since you were last screened, and you are at increased risk for chlamydia or gonorrhea. Ask your health care provider if you are at risk. Ask your health care provider about whether you are at high risk for HIV. Your health care provider may recommend a prescription medicine to help prevent HIV infection. If you choose to take medicine to prevent HIV, you should first get tested for HIV. You should then be tested every 3 months for as long as you are taking the medicine. Follow these instructions at home: Alcohol use Do not drink alcohol if your health care provider tells you not to drink. If you drink alcohol: Limit how much you have to 0-2 drinks a day. Know how much alcohol is in your drink. In the [place] one drink equals one 12 oz bottle of beer (355 mL), one 5 oz glass of wine (148 mL), or one 1 oz glass of hard liquor (44 mL). Lifestyle Do not use any products that contain nicotine or tobacco. These products include cigarettes, chewing tobacco, and vaping devices, such as e-cigarettes. If you need help quitting, ask your health care provider. Do not use street drugs. Do not share needles. Ask your health care provider for help if you need support or information about quitting drugs. General instructions Schedule regular health, dental, and eye exams. Stay current with your  vaccines. Tell your health care provider if: You often feel depressed. You have ever been abused or do not feel safe at home. Summary Adopting a healthy lifestyle and getting preventive care are important in promoting health and wellness. Follow your health care provider's instructions about healthy diet, exercising, and getting tested or screened for diseases. Follow your health care provider's instructions on monitoring your cholesterol and blood pressure. This information is not intended to replace advice given to you by your health care provider. Make sure you discuss any questions you have with your health care provider. Document Revised: 06/30/2020 Document Reviewed: 06/30/2020 Elsevier Patient Education  2023 [employer].

## 2022-06-16 NOTE — Progress Notes (Signed)
Subjective:   Rodney Thompson is a 67 y.o. male who presents for a Welcome to Medicare exam.   He is here today for a full physical exam. It is also time for his Welcome to Medicare visit. He reports compliance with meds. He has no specific concerns or complaints at this time.   He is followed by Dr. Retta Diones for his prostate exams.   Hypertension This is a chronic problem. The current episode started more than 1 year ago. The problem has been gradually improving since onset. The problem is controlled. Pertinent negatives include no blurred vision, chest pain, palpitations or shortness of breath. Risk factors for coronary artery disease include dyslipidemia and male gender. Past treatments include ACE inhibitors. The current treatment provides moderate improvement.   Review of Systems  Constitutional: Negative.   HENT: Negative.    Eyes: Negative.  Negative for blurred vision.  Respiratory: Negative.  Negative for shortness of breath.   Cardiovascular: Negative.  Negative for chest pain and palpitations.  Gastrointestinal: Negative.   Genitourinary: Negative.   Musculoskeletal: Negative.   Skin: Negative.   Neurological: Negative.   Endo/Heme/Allergies: Negative.   Psychiatric/Behavioral: Negative.        Objective:    Today's Vitals   06/16/22 0913  BP: 118/82  Pulse: 81  Temp: 97.7 F (36.5 C)  SpO2: 98%  Weight: 174 lb 6.4 oz (79.1 kg)  Height: 5\' 7"  (1.702 m)  PainSc: 0-No pain   Body mass index is 27.31 kg/m.  Medications Outpatient Encounter Medications as of 06/16/2022  Medication Sig   fish oil-omega-3 fatty acids 1000 MG capsule Take 1 g by mouth daily.   lisinopril (ZESTRIL) 5 MG tablet TAKE 1 TABLET(5 MG) BY MOUTH DAILY   rosuvastatin (CRESTOR) 20 MG tablet Take 1 tablet (20 mg total) by mouth daily.   [DISCONTINUED] amoxicillin-clavulanate (AUGMENTIN) 875-125 MG tablet Take 1 tablet by mouth 2 (two) times daily.   No facility-administered encounter  medications on file as of 06/16/2022.     History: Past Medical History:  Diagnosis Date   Hyperlipemia    Hypertension    Plantar fasciitis, bilateral 1978   Past Surgical History:  Procedure Laterality Date   cataract surgery Bilateral 2019   COLONOSCOPY     HEEL SPUR SURGERY Bilateral 1978   Also 1979 and 1989.  Plantar fasciitis   rotator cuff surgery Right 10/27/2021   rotator cuff surgery Right 03/25/2022   SHOULDER ARTHROSCOPY WITH BICEPSTENOTOMY Left 12/18/2012   Procedure: SHOULDER ARTHROSCOPY, SUBACROMIAL DECOMPRESSION, OPEN BICEPS TENOTOMY;  Surgeon: Mable Paris, MD;  Location: [facility];  Service: Orthopedics;  Laterality: Left;   SHOULDER SURGERY  2012   right   TONSILLECTOMY     US ECHOCARDIOGRAPHY  04/28/2011   trace MR and TR; EF =>55%    Family History  Problem Relation Age of Onset   Hypertension Mother 9   Cancer Father 38   CVA Paternal Grandfather    Hypertension Sister    Hypertension Sister    Social History   Occupational History   Not on file  Tobacco Use   Smoking status: Never   Smokeless tobacco: Never  Vaping Use   Vaping Use: Never used  Substance and Sexual Activity   Alcohol use: Yes    Alcohol/week: 14.0 standard drinks of alcohol    Types: 14 Standard drinks or equivalent per week   Drug use: Never   Sexual activity: Not on file  Tobacco Counseling Counseling given: Yes   Immunizations and Health Maintenance Immunization History  Administered Date(s) Administered   Fluad Quad(high Dose 65+) 11/23/2021   Influenza Inj Mdck Quad Pf 12/07/2017   Influenza,inj,Quad PF,6+ Mos 10/24/2018, 11/12/2019   Influenza-Unspecified 10/24/2018, 10/24/2020   PFIZER Comirnaty(Gray Top)Covid-19 Tri-Sucrose Vaccine 07/11/2020   PFIZER(Purple Top)SARS-COV-2 Vaccination 05/15/2019, 06/05/2019, 12/26/2019, 10/24/2020   Pfizer Covid-19 Vaccine Bivalent Booster 29yrs & up 10/31/2020, 12/07/2021   Pneumococcal  Conjugate-13 01/03/2013   Zoster Recombinat (Shingrix) 12/03/2019, 03/03/2020   Health Maintenance Due  Topic Date Due   COVID-19 Vaccine (7 - 2023-24 season) 02/01/2022    Activities of Daily Living    06/16/2022    9:38 AM 06/16/2022    9:09 AM  In your present state of health, do you have any difficulty performing the following activities:  Hearing?  0  Vision?  0  Difficulty concentrating or making decisions?  0  Walking or climbing stairs?  0  Dressing or bathing?  0  Doing errands, shopping?  0  Preparing Food and eating ? N   Using the Toilet? N   In the past six months, have you accidently leaked urine? N   Do you have problems with loss of bowel control? N   Managing your Medications? N   Managing your Finances? N   Housekeeping or managing your Housekeeping? N     Physical Exam   Physical Exam Vitals and nursing note reviewed.  Constitutional:      Appearance: Normal appearance.  HENT:     Head: Normocephalic and atraumatic.     Right Ear: Tympanic membrane, ear canal and external ear normal.     Left Ear: Ear canal and external ear normal.     Mouth/Throat:     Mouth: Mucous membranes are dry.     Pharynx: Oropharynx is clear. No oropharyngeal exudate or posterior oropharyngeal erythema.  Eyes:     Extraocular Movements: Extraocular movements intact.     Pupils: Pupils are equal, round, and reactive to light.  Cardiovascular:     Rate and Rhythm: Normal rate and regular rhythm.     Pulses: Normal pulses.     Heart sounds: Normal heart sounds.  Pulmonary:     Effort: Pulmonary effort is normal.     Breath sounds: Normal breath sounds.  Abdominal:     General: Bowel sounds are normal.     Palpations: Abdomen is soft.  Genitourinary:    Comments: Deferred  Musculoskeletal:        General: Normal range of motion.     Cervical back: Normal range of motion.  Skin:    General: Skin is warm and dry.  Neurological:     General: No focal deficit present.      Mental Status: He is alert and oriented to person, place, and time.  Psychiatric:        Mood and Affect: Mood normal.      (optional), or other factors deemed appropriate based on the beneficiary's medical and social history and current clinical standards.  Advanced Directives: Does Patient Have a Medical Advance Directive?: Yes Type of Advance Directive: Living will, Healthcare Power of Attorney Does patient want to make changes to medical advance directive?: No - Patient declined Copy of Healthcare Power of Attorney in Chart?: No - copy requested    Assessment:    This is a routine wellness  examination for this patient .   Vision/Hearing screen Hearing Screening   500Hz  1000Hz   2000Hz  4000Hz   Right ear Pass Pass Pass Pass  Left ear Pass Pass Pass Pass   Vision Screening   Right eye Left eye Both eyes  Without correction 20/30 20/30 20/30   With correction       Dietary issues and exercise activities discussed:  Current Exercise Habits: Home exercise routine, Type of exercise: walking, Time (Minutes): > 60, Frequency (Times/Week): 5, Weekly Exercise (Minutes/Week): 0, Intensity: Moderate, Exercise limited by: orthopedic condition(s)   Goals      Weight loss     Patient plans to lose weight. His goal weight : 165-168.         Depression Screen    06/16/2022    9:01 AM 05/04/2022    9:21 AM 11/23/2021    8:32 AM 05/20/2021    8:50 AM  PHQ 2/9 Scores  PHQ - 2 Score 0 0 0 0  PHQ- 9 Score 0  0      Fall Risk    06/16/2022    9:01 AM  Fall Risk   Falls in the past year? 0  Number falls in past yr: 0  Injury with Fall? 0  Risk for fall due to : No Fall Risks  Follow up Falls evaluation completed    Cognitive Function        06/16/2022    9:10 AM  6CIT Screen  What Year? 0 points  What month? 0 points  What time? 0 points  Count back from 20 0 points  Months in reverse 0 points  Repeat phrase 2 points  Total Score 2 points    Patient Care  Team: Dorothyann Peng, MD as PCP - General (Internal Medicine) Thurmon Fair, MD as PCP - Cardiology (Cardiology)     Plan:   1. Medicare welcome exam The annual wellness visit was performed including physical exam, discussion of advanced directives, assessment of functional status and cognitive function. EKG performed, NSR w/o acute changes. He will rto in one year for AWV with [facility] Advisor. PATIENT IS ADVISED TO GET 30-45 MINUTES REGULAR EXERCISE NO LESS THAN FOUR TO FIVE DAYS PER WEEK - BOTH WEIGHTBEARING EXERCISES AND AEROBIC ARE RECOMMENDED.  PATIENT IS ADVISED TO FOLLOW A HEALTHY DIET WITH AT LEAST SIX FRUITS/VEGGIES PER DAY, DECREASE INTAKE OF RED MEAT, AND TO INCREASE FISH INTAKE TO TWO DAYS PER WEEK.  MEATS/FISH SHOULD NOT BE FRIED, BAKED OR BROILED IS PREFERABLE.  IT IS ALSO IMPORTANT TO CUT BACK ON YOUR SUGAR INTAKE. PLEASE AVOID ANYTHING WITH ADDED SUGAR, CORN SYRUP OR OTHER SWEETENERS. IF YOU MUST USE A SWEETENER, YOU CAN TRY STEVIA. IT IS ALSO IMPORTANT TO AVOID ARTIFICIALLY SWEETENERS AND DIET BEVERAGES. LASTLY, I SUGGEST WEARING SPF 50 SUNSCREEN ON EXPOSED PARTS AND ESPECIALLY WHEN IN THE DIRECT SUNLIGHT FOR AN EXTENDED PERIOD OF TIME.  PLEASE AVOID FAST FOOD RESTAURANTS AND INCREASE YOUR WATER INTAKE.  - EKG 12-Lead  2. Essential hypertension, benign Comments: Chronic, controlled. Goal BP < 120/80.  EKG performed, NSR w/o acute changes . Advised to follow low sodium diet. He will c/w lisinopril 5mg  qd, f/u 6 months.  He is encouraged to follow a low sodium diet.  - EKG 12-Lead - Microalbumin / creatinine urine ratio - POCT urinalysis dipstick - CMP14+EGFR - Lipid panel - CBC - TSH  3. Pure hypercholesterolemia Comments: Chronic, he will c/w rosuvastatin daily. Will adjust meds as needed. - CMP14+EGFR - Lipid panel  4. Body mass index (BMI) of 27.0-27.9 in adult Comments: He is encouraged to aim  for at least 150 minutes of exercise/week.  5. Prostate cancer  screening Comments: DRE deferred. I will check PSA and forward to his urologist for review. - PSA Total (Reflex To Free)    I have personally reviewed and noted the following in the patient's chart:   Medical and social history Use of alcohol, tobacco or illicit drugs  Current medications and supplements Functional ability and status Nutritional status Physical activity Advanced directives List of other physicians Hospitalizations, surgeries, and ER visits in previous 12 months Vitals Screenings to include cognitive, depression, and falls Referrals and appointments  In addition, I have reviewed and discussed with patient certain preventive protocols, quality metrics, and best practice recommendations. A written personalized care plan for preventive services as well as general preventive health recommendations were provided to patient.    Gwynneth Aliment, MD 06/19/2022

## 2022-06-17 LAB — CMP14+EGFR
ALT: 54 IU/L — ABNORMAL HIGH (ref 0–44)
AST: 30 IU/L (ref 0–40)
Albumin/Globulin Ratio: 2.5 — ABNORMAL HIGH (ref 1.2–2.2)
Albumin: 4.3 g/dL (ref 3.9–4.9)
Alkaline Phosphatase: 103 IU/L (ref 44–121)
BUN/Creatinine Ratio: 13 (ref 10–24)
BUN: 14 mg/dL (ref 8–27)
Bilirubin Total: 0.9 mg/dL (ref 0.0–1.2)
CO2: 21 mmol/L (ref 20–29)
Calcium: 9.2 mg/dL (ref 8.6–10.2)
Chloride: 104 mmol/L (ref 96–106)
Creatinine, Ser: 1.09 mg/dL (ref 0.76–1.27)
Globulin, Total: 1.7 g/dL (ref 1.5–4.5)
Glucose: 88 mg/dL (ref 70–99)
Potassium: 4.5 mmol/L (ref 3.5–5.2)
Sodium: 140 mmol/L (ref 134–144)
Total Protein: 6 g/dL (ref 6.0–8.5)
eGFR: 75 mL/min/{1.73_m2} (ref >59)

## 2022-06-17 LAB — PSA TOTAL (REFLEX TO FREE): Prostate Specific Ag, Serum: 2.6 ng/mL (ref 0.0–4.0)

## 2022-06-17 LAB — CBC
Hematocrit: 44.7 % (ref 37.5–51.0)
Hemoglobin: 15.4 g/dL (ref 13.0–17.7)
MCH: 29.8 pg (ref 26.6–33.0)
MCHC: 34.5 g/dL (ref 31.5–35.7)
MCV: 87 fL (ref 79–97)
Platelets: 207 10*3/uL (ref 150–450)
RBC: 5.17 x10E6/uL (ref 4.14–5.80)
RDW: 13.5 % (ref 11.6–15.4)
WBC: 6 10*3/uL (ref 3.4–10.8)

## 2022-06-17 LAB — MICROALBUMIN / CREATININE URINE RATIO
Creatinine, Urine: 148.5 mg/dL
Microalb/Creat Ratio: 14 mg/g creat (ref 0–29)
Microalbumin, Urine: 20.5 ug/mL

## 2022-06-17 LAB — LIPID PANEL
Chol/HDL Ratio: 2.7 ratio (ref 0.0–5.0)
Cholesterol, Total: 156 mg/dL (ref 100–199)
HDL: 58 mg/dL (ref >39)
LDL Chol Calc (NIH): 75 mg/dL (ref 0–99)
Triglycerides: 130 mg/dL (ref 0–149)
VLDL Cholesterol Cal: 23 mg/dL (ref 5–40)

## 2022-06-17 LAB — TSH: TSH: 2.68 u[IU]/mL (ref 0.450–4.500)

## 2022-12-16 ENCOUNTER — Ambulatory Visit (INDEPENDENT_AMBULATORY_CARE_PROVIDER_SITE_OTHER): Payer: Medicare Other | Admitting: Internal Medicine

## 2022-12-16 ENCOUNTER — Encounter: Payer: Self-pay | Admitting: Internal Medicine

## 2022-12-16 VITALS — BP 118/80 | HR 65 | Temp 97.8°F | Ht 67.0 in | Wt 171.8 lb

## 2022-12-16 DIAGNOSIS — R61 Generalized hyperhidrosis: Secondary | ICD-10-CM

## 2022-12-16 DIAGNOSIS — E7800 Pure hypercholesterolemia, unspecified: Secondary | ICD-10-CM | POA: Diagnosis not present

## 2022-12-16 DIAGNOSIS — I10 Essential (primary) hypertension: Secondary | ICD-10-CM

## 2022-12-16 LAB — BASIC METABOLIC PANEL
BUN: 16 (ref 4–21)
CO2: 25 — AB (ref 13–22)
Chloride: 104 (ref 99–108)
Creatinine: 1.1 (ref 0.6–1.3)
Glucose: 108
Potassium: 5.4 meq/L — AB (ref 3.5–5.1)
Sodium: 140 (ref 137–147)

## 2022-12-16 LAB — COMPREHENSIVE METABOLIC PANEL
Albumin: 4.2 (ref 3.5–5.0)
Calcium: 9.5 (ref 8.7–10.7)
Globulin: 1.9
eGFR: 75

## 2022-12-16 LAB — HEPATIC FUNCTION PANEL
ALT: 47 U/L — AB (ref 10–40)
AST: 26 (ref 14–40)
Alkaline Phosphatase: 101 (ref 25–125)
Bilirubin, Total: 0.6

## 2022-12-16 LAB — CBC AND DIFFERENTIAL
HCT: 49 (ref 41–53)
Hemoglobin: 16.7 (ref 13.5–17.5)
Platelets: 190 10*3/uL (ref 150–400)
WBC: 6.2

## 2022-12-16 LAB — CBC: RBC: 5.56 — AB (ref 3.87–5.11)

## 2022-12-16 NOTE — Assessment & Plan Note (Signed)
Chronic, controlled with lisinopril 5mg  daily. He is encouraged to follow low sodium diet. He will rto in six months for full physical exam.

## 2022-12-16 NOTE — Patient Instructions (Signed)
Hypertension, Adult Hypertension is another name for high blood pressure. High blood pressure forces your heart to work harder to pump blood. This can cause problems over time. There are two numbers in a blood pressure reading. There is a top number (systolic) over a bottom number (diastolic). It is best to have a blood pressure that is below 120/80. What are the causes? The cause of this condition is not known. Some other conditions can lead to high blood pressure. What increases the risk? Some lifestyle factors can make you more likely to develop high blood pressure: Smoking. Not getting enough exercise or physical activity. Being overweight. Having too much fat, sugar, calories, or salt (sodium) in your diet. Drinking too much alcohol. Other risk factors include: Having any of these conditions: Heart disease. Diabetes. High cholesterol. Kidney disease. Obstructive sleep apnea. Having a family history of high blood pressure and high cholesterol. Age. The risk increases with age. Stress. What are the signs or symptoms? High blood pressure may not cause symptoms. Very high blood pressure (hypertensive crisis) may cause: Headache. Fast or uneven heartbeats (palpitations). Shortness of breath. Nosebleed. Vomiting or feeling like you may vomit (nauseous). Changes in how you see. Very bad chest pain. Feeling dizzy. Seizures. How is this treated? This condition is treated by making healthy lifestyle changes, such as: Eating healthy foods. Exercising more. Drinking less alcohol. Your doctor may prescribe medicine if lifestyle changes do not help enough and if: Your top number is above 130. Your bottom number is above 80. Your personal target blood pressure may vary. Follow these instructions at home: Eating and drinking  If told, follow the DASH eating plan. To follow this plan: Fill one half of your plate at each meal with fruits and vegetables. Fill one fourth of your plate  at each meal with whole grains. Whole grains include whole-wheat pasta, brown rice, and whole-grain bread. Eat or drink low-fat dairy products, such as skim milk or low-fat yogurt. Fill one fourth of your plate at each meal with low-fat (lean) proteins. Low-fat proteins include fish, chicken without skin, eggs, beans, and tofu. Avoid fatty meat, cured and processed meat, or chicken with skin. Avoid pre-made or processed food. Limit the amount of salt in your diet to less than 1,500 mg each day. Do not drink alcohol if: Your doctor tells you not to drink. You are pregnant, may be pregnant, or are planning to become pregnant. If you drink alcohol: Limit how much you have to: 0-1 drink a day for women. 0-2 drinks a day for men. Know how much alcohol is in your drink. In the [place] one drink equals one 12 oz bottle of beer (355 mL), one 5 oz glass of wine (148 mL), or one 1 oz glass of hard liquor (44 mL). Lifestyle  Work with your doctor to stay at a healthy weight or to lose weight. Ask your doctor what the best weight is for you. Get at least 30 minutes of exercise that causes your heart to beat faster (aerobic exercise) most days of the week. This may include walking, swimming, or biking. Get at least 30 minutes of exercise that strengthens your muscles (resistance exercise) at least 3 days a week. This may include lifting weights or doing Pilates. Do not smoke or use any products that contain nicotine or tobacco. If you need help quitting, ask your doctor. Check your blood pressure at home as told by your doctor. Keep all follow-up visits. Medicines Take over-the-counter and prescription medicines   only as told by your doctor. Follow directions carefully. Do not skip doses of blood pressure medicine. The medicine does not work as well if you skip doses. Skipping doses also puts you at risk for problems. Ask your doctor about side effects or reactions to medicines that you should watch  for. Contact a doctor if: You think you are having a reaction to the medicine you are taking. You have headaches that keep coming back. You feel dizzy. You have swelling in your ankles. You have trouble with your vision. Get help right away if: You get a very bad headache. You start to feel mixed up (confused). You feel weak or numb. You feel faint. You have very bad pain in your: Chest. Belly (abdomen). You vomit more than once. You have trouble breathing. These symptoms may be an emergency. Get help right away. Call 911. Do not wait to see if the symptoms will go away. Do not drive yourself to the hospital. Summary Hypertension is another name for high blood pressure. High blood pressure forces your heart to work harder to pump blood. For most people, a normal blood pressure is less than 120/80. Making healthy choices can help lower blood pressure. If your blood pressure does not get lower with healthy choices, you may need to take medicine. This information is not intended to replace advice given to you by your health care provider. Make sure you discuss any questions you have with your health care provider. Document Revised: 11/27/2020 Document Reviewed: 11/27/2020 Elsevier Patient Education  2024 Elsevier Inc.  

## 2022-12-16 NOTE — Progress Notes (Signed)
I,Victoria T Deloria Lair, CMA,acting as a Neurosurgeon for Gwynneth Aliment, MD.,have documented all relevant documentation on the behalf of Gwynneth Aliment, MD,as directed by  Gwynneth Aliment, MD while in the presence of Gwynneth Aliment, MD.  Subjective:  Patient ID: Rodney Thompson , male    DOB: [birth year] , 67 y.o.   MRN: [id number]  Chief Complaint  Patient presents with   Hypertension   Hyperlipidemia    HPI  Patient presents for hypertension & cholesterol follow up. He states that he is compliant with medication. Denies headache, chest pain, and blurred vision. He denies having any specific questions or concerns.   Hypertension This is a chronic problem. The current episode started more than 1 year ago. The problem has been gradually improving since onset. The problem is controlled. Pertinent negatives include no blurred vision, chest pain or palpitations. Risk factors for coronary artery disease include dyslipidemia and male gender. Past treatments include ACE inhibitors. The current treatment provides moderate improvement.     Past Medical History:  Diagnosis Date   Hyperlipemia    Hypertension    Plantar fasciitis, bilateral 1978     Family History  Problem Relation Age of Onset   Hypertension Mother 13   Cancer Father 76   CVA Paternal Grandfather    Hypertension Sister    Hypertension Sister      Current Outpatient Medications:    fish oil-omega-3 fatty acids 1000 MG capsule, Take 1 g by mouth daily., Disp: , Rfl:    lisinopril (ZESTRIL) 5 MG tablet, TAKE 1 TABLET(5 MG) BY MOUTH DAILY, Disp: 90 tablet, Rfl: 2   rosuvastatin (CRESTOR) 20 MG tablet, Take 1 tablet (20 mg total) by mouth daily., Disp: 90 tablet, Rfl: 3   Allergies  Allergen Reactions   Simcor [Niacin-Simvastatin Er] Other (See Comments)    flushing   Vicodin [Hydrocodone-Acetaminophen] Itching and Rash     Review of Systems  Constitutional: Negative.   HENT: Negative.    Eyes: Negative.  Negative for  blurred vision.  Respiratory: Negative.    Cardiovascular: Negative.  Negative for chest pain and palpitations.  Endocrine:       He adds that he has been having night sweats for several months. He thinks his sx started after he was switched from simvastatin to rosuvastatin. No associated fever/chills, cough or unintentional weight loss.    Skin: Negative.   Psychiatric/Behavioral: Negative.       Today's Vitals   12/16/22 0828  BP: 118/80  Pulse: 65  Temp: 97.8 F (36.6 C)  SpO2: 98%  Weight: 171 lb 12.8 oz (77.9 kg)  Height: 5\' 7"  (1.702 m)   Body mass index is 26.91 kg/m.  Wt Readings from Last 3 Encounters:  12/16/22 171 lb 12.8 oz (77.9 kg)  06/16/22 174 lb 6.4 oz (79.1 kg)  05/04/22 174 lb 9.6 oz (79.2 kg)     Objective:  Physical Exam Vitals and nursing note reviewed.  Constitutional:      Appearance: Normal appearance.  HENT:     Head: Normocephalic and atraumatic.  Eyes:     Extraocular Movements: Extraocular movements intact.  Cardiovascular:     Rate and Rhythm: Normal rate and regular rhythm.  Pulmonary:     Effort: Pulmonary effort is normal.     Breath sounds: Normal breath sounds.  Genitourinary:    Comments: deferred Musculoskeletal:        General: Normal range of motion.     Cervical back: Normal  range of motion and neck supple.  Skin:    General: Skin is warm and dry.  Neurological:     General: No focal deficit present.     Mental Status: He is alert and oriented to person, place, and time.  Psychiatric:        Mood and Affect: Mood normal.        Behavior: Behavior normal.         Assessment And Plan:  Essential hypertension, benign Assessment & Plan: Chronic, controlled with lisinopril 5mg  daily. He is encouraged to follow low sodium diet. He will rto in six months for full physical exam.   Orders: -     CMP14+EGFR  Pure hypercholesterolemia Assessment & Plan: Chronic, he will continue with rosuvastatin 20mg  daily for now. If  he continues to be symptomatic, may need to consider switch to atorvastatin. He plans to schedule f/u Cardiology appt in the near future.   Orders: -     CMP14+EGFR  Night sweats Assessment & Plan: It does appear statins can cause night sweats.  Thyroid function normal in April; however, I will recheck this today. I will also check CBC. We also discussed possibility of sulfites in wine causing flushing. He agrees to abstain from alcohol for 1-2 weeks to see if sx persist.   Orders: -     CBC -     TSH     Return if symptoms worsen or fail to improve.  Patient was given opportunity to ask questions. Patient verbalized understanding of the plan and was able to repeat key elements of the plan. All questions were answered to their satisfaction.    I, Gwynneth Aliment, MD, have reviewed all documentation for this visit. The documentation on 12/16/22 for the exam, diagnosis, procedures, and orders are all accurate and complete.   IF YOU HAVE BEEN REFERRED TO A SPECIALIST, IT MAY TAKE 1-2 WEEKS TO SCHEDULE/PROCESS THE REFERRAL. IF YOU HAVE NOT HEARD FROM US/SPECIALIST IN TWO WEEKS, PLEASE GIVE [place] A CALL AT [phone] X [phone].   THE PATIENT IS ENCOURAGED TO PRACTICE SOCIAL DISTANCING DUE TO THE COVID-19 PANDEMIC.

## 2022-12-16 NOTE — Assessment & Plan Note (Signed)
Chronic, he will continue with rosuvastatin 20mg  daily for now. If he continues to be symptomatic, may need to consider switch to atorvastatin. He plans to schedule f/u Cardiology appt in the near future.

## 2022-12-16 NOTE — Assessment & Plan Note (Signed)
It does appear statins can cause night sweats.  Thyroid function normal in April; however, I will recheck this today. I will also check CBC. We also discussed possibility of sulfites in wine causing flushing. He agrees to abstain from alcohol for 1-2 weeks to see if sx persist.

## 2022-12-18 LAB — CBC
Hematocrit: 49 % (ref 37.5–51.0)
Hemoglobin: 16.7 g/dL (ref 13.0–17.7)
MCH: 30 pg (ref 26.6–33.0)
MCHC: 34.1 g/dL (ref 31.5–35.7)
MCV: 88 fL (ref 79–97)
Platelets: 190 10*3/uL (ref 150–450)
RBC: 5.56 x10E6/uL (ref 4.14–5.80)
RDW: 14.1 % (ref 11.6–15.4)
WBC: 6.2 10*3/uL (ref 3.4–10.8)

## 2022-12-18 LAB — TSH

## 2022-12-18 LAB — CMP14+EGFR

## 2022-12-22 LAB — LAB REPORT - SCANNED: EGFR: 75

## 2023-01-14 ENCOUNTER — Telehealth (HOSPITAL_BASED_OUTPATIENT_CLINIC_OR_DEPARTMENT_OTHER): Payer: Self-pay | Admitting: Cardiovascular Disease

## 2023-01-14 NOTE — Telephone Encounter (Signed)
Patient stopped by to request a change to the [facility] office for future care. Informed patient that I was not sure which providers were taking new patients and when the next appointment would be but that we would inquire and get back to him. Patient says that his is fine seeing any provider and that he is in overall good health but is having nighttime hot flashes that he has attributed to his rosuvastatin. He said that he sent a MyChart message about the hot flashes. Please advise re which provider is most appropriate at [facility] and taking new patients.

## 2023-01-14 NOTE — Telephone Encounter (Signed)
Ok with me 

## 2023-01-14 NOTE — Telephone Encounter (Signed)
Mr. Hanish has predominantly been follows by [facility] for his HTN, HLD.  Most recent visits: 02/2022 (phone visit preop), 12/2021 (in person recommended for 1 year f/u), 12/2020, 03/2018, 03/2017. Last seen by MD Dr. Royann Shivers 04/2013.   Will route to Dr. Royann Shivers to ensure he is okay with switch per office protocol.  Regardless of Dr. Duke Salvia vs Dr. Cristal Deer upcoming availability is limited. Will ask scheduling team to contact him to inquire if he would be okay with seeing myself to establish at [facility] location and then we can get next OV with MD.   Alver Sorrow, NP

## 2023-01-15 ENCOUNTER — Encounter: Payer: Self-pay | Admitting: Internal Medicine

## 2023-01-18 NOTE — Telephone Encounter (Signed)
Appointment scheduled 03/01/2023

## 2023-03-01 ENCOUNTER — Encounter (HOSPITAL_BASED_OUTPATIENT_CLINIC_OR_DEPARTMENT_OTHER): Payer: Self-pay | Admitting: Family

## 2023-03-01 ENCOUNTER — Ambulatory Visit (HOSPITAL_BASED_OUTPATIENT_CLINIC_OR_DEPARTMENT_OTHER): Payer: Medicare Other | Admitting: Family

## 2023-03-01 VITALS — BP 118/80 | HR 76 | Ht 67.5 in | Wt 175.8 lb

## 2023-03-01 DIAGNOSIS — I10 Essential (primary) hypertension: Secondary | ICD-10-CM

## 2023-03-01 DIAGNOSIS — E782 Mixed hyperlipidemia: Secondary | ICD-10-CM | POA: Diagnosis not present

## 2023-03-01 MED ORDER — ROSUVASTATIN CALCIUM 20 MG PO TABS: 20.0000 mg | ORAL_TABLET | Freq: Every day | ORAL | 3 refills | Status: DC

## 2023-03-01 MED ORDER — LISINOPRIL 5 MG PO TABS: ORAL_TABLET | ORAL | 3 refills | Status: DC

## 2023-03-01 NOTE — Patient Instructions (Signed)
 Medication Instructions:  Continue your current medications.   *If you need a refill on your cardiac medications before your next appointment, please call your pharmacy*  Follow-Up: At [facility], you and your health needs are our priority.  As part of our continuing mission to provide you with exceptional heart care, we have created designated Provider Care Teams.  These Care Teams include your primary Cardiologist (physician) and Advanced Practice Providers (APPs -  Physician Assistants and Nurse Practitioners) who all work together to provide you with the care you need, when you need it.  We recommend signing up for the patient portal called MyChart.  Sign up information is provided on this After Visit Summary.  MyChart is used to connect with patients for Virtual Visits (Telemedicine).  Patients are able to view lab/test results, encounter notes, upcoming appointments, etc.  Non-urgent messages can be sent to your provider as well.   To learn more about what you can do with MyChart, go to [email or web address].    Your next appointment:   1 year(s)  Provider:   Reche Finder, NP    Other Instructions  Heart Healthy Diet Recommendations: A low-salt diet is recommended. Meats should be grilled, baked, or boiled. Avoid fried foods. Focus on lean protein sources like fish or chicken with vegetables and fruits. The American Heart Association is a Chief Technology Officer!  American Heart Association Diet and Lifeystyle Recommendations   Exercise recommendations: The American Heart Association recommends 150 minutes of moderate intensity exercise weekly. Try 30 minutes of moderate intensity exercise 4-5 times per week. This could include walking, jogging, or swimming.

## 2023-03-01 NOTE — Progress Notes (Signed)
 Cardiology Office Note:  .   Date:  03/01/2023  ID:  Rodney Thompson, DOB Aug 30, 1955, MRN [id number] PCP: Jarold Medici, MD  [facility] Providers Cardiologist:  Jerel Balding, MD Cardiology APP:  Vannie Reche RAMAN, NP    History of Present Illness: .   Rodney Thompson is a 68 y.o. male with history of orthostatic hypotension, hypertension, hyperlipidemia.  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  Stress testing 2013 with no ischemia.  Echo 01/2021 low normal LVEF 50-55%, moderate LVH, gr1dd, trivial MR.   Presents today for follow-up independently. Excited for upcoming trip to [place].  Exercising 3 times per week at stage with combination of aerobics and weight training.  Additionally with playing golf.  He notes occasional episodes of sweating at night and inquires if this could be related to medication, was reassured that it does not.  Reports BP at home routinely at goal of less than 130/80. Reports no shortness of breath nor dyspnea on exertion. Reports no chest pain, pressure, or tightness. No edema, orthopnea, PND. Reports no palpitations.     ROS: Please see the history of present illness.    All other systems reviewed and are negative.   Studies Reviewed: .        Cardiac Studies & Procedures     STRESS TESTS  NM MYOCAR MULTI W/SPECT W 04/28/2011  ECHOCARDIOGRAM  ECHOCARDIOGRAM COMPLETE 01/30/2021  Narrative ECHOCARDIOGRAM REPORT    Patient Name:   Rodney Thompson Date of Exam: 01/30/2021 Medical Rec #:  [id number]    Height:       67.0 in Accession #:    [id number]   Weight:       171.8 lb Date of Birth:  [birth year]    BSA:          1.896 m Patient Age:    65 years     BP:           120/78 mmHg Patient Gender: M            HR:           80 bpm. Exam Location:  Outpatient  Procedure: 2D Echo, Cardiac Doppler, Color Doppler and Strain Analysis  Indications:    R06.9 DOE  History:        Patient has prior history of Echocardiogram examinations, most recent  04/28/2011. Signs/Symptoms:Shortness of Breath and Fatigue; Risk Factors:Hypertension, Non-Smoker and Dyslipidemia. Patient states he has shortness of breath and fatigue. Denies chest pain. No lower extremity edema.  Sonographer:    Tillman Nora RVT Sonographer#2:  Annabella Cater RVT, RDCS (AE), RDMS Referring Phys: [phone] [facility] NICOLE DUKE   Sonographer Comments: Image acquisition challenging due to respiratory motion. IMPRESSIONS   1. Left ventricular ejection fraction, by estimation, is 50 to 55%. The left ventricle has low normal function. The left ventricle has no regional wall motion abnormalities. There is moderate hypertrophy of the basal-septum. The rest of the LV segments demonstrate mild concentric left ventricular hypertrophy. Left ventricular diastolic parameters are consistent with Grade I diastolic dysfunction (impaired relaxation). 2. Right ventricular systolic function is normal. The right ventricular size is normal. 3. The mitral valve is normal in structure. Trivial mitral valve regurgitation. 4. The aortic valve is tricuspid. There is mild calcification of the aortic valve. There is mild thickening of the aortic valve. Aortic valve regurgitation is not visualized.  Comparison(s): Compared to prior TTE report in 2013, the LVEF now appears low normal (50-55%). Previously reported as >55%.  FINDINGS  Left Ventricle: Left ventricular ejection fraction, by estimation, is 50 to 55%. The left ventricle has low normal function. The left ventricle has no regional wall motion abnormalities. Global longitudinal strain performed but not reported based on interpreter judgement due to suboptimal tracking. The left ventricular internal cavity size was normal in size. There is moderate hypertrophy of the basal-septum. The rest of the LV segments demonstrate mild concentric left ventricular hypertrophy. Left ventricular diastolic parameters are consistent with Grade I diastolic dysfunction  (impaired relaxation).  Right Ventricle: The right ventricular size is normal. No increase in right ventricular wall thickness. Right ventricular systolic function is normal.  Left Atrium: Left atrial size was normal in size.  Right Atrium: Right atrial size was normal in size.  Pericardium: There is no evidence of pericardial effusion.  Mitral Valve: The mitral valve is normal in structure. There is mild thickening of the mitral valve leaflet(s). There is mild calcification of the mitral valve leaflet(s). Mild mitral annular calcification. Trivial mitral valve regurgitation.  Tricuspid Valve: The tricuspid valve is normal in structure. Tricuspid valve regurgitation is trivial.  Aortic Valve: The aortic valve is tricuspid. There is mild calcification of the aortic valve. There is mild thickening of the aortic valve. Aortic valve regurgitation is not visualized. Aortic valve mean gradient measures 2.0 mmHg. Aortic valve peak gradient measures 4.1 mmHg. Aortic valve area, by VTI measures 4.08 cm.  Pulmonic Valve: The pulmonic valve was not well visualized. Pulmonic valve regurgitation is trivial.  Aorta: The aortic root is normal in size and structure.  IAS/Shunts: No atrial level shunt detected by color flow Doppler.   LEFT VENTRICLE PLAX 2D LVIDd:         5.03 cm     Diastology LVIDs:         3.53 cm     LV e' medial:    5.11 cm/s LV PW:         0.83 cm     LV E/e' medial:  9.1 LV IVS:        0.86 cm     LV e' lateral:   8.05 cm/s LVOT diam:     2.60 cm     LV E/e' lateral: 5.8 LV SV:         67 LV SV Index:   35 LVOT Area:     5.31 cm  3D Volume EF: LV Volumes (MOD)           3D EF:        59 % LV vol d, MOD A2C: 62.6 ml LV EDV:       82 ml LV vol d, MOD A4C: 91.0 ml LV ESV:       34 ml LV vol s, MOD A2C: 32.0 ml LV SV:        48 ml LV vol s, MOD A4C: 40.8 ml LV SV MOD A2C:     30.6 ml LV SV MOD A4C:     91.0 ml LV SV MOD BP:      42.3 ml  RIGHT VENTRICLE RV Basal  diam:  2.57 cm RV Mid diam:    2.33 cm RV S prime:     10.80 cm/s TAPSE (M-mode): 2.0 cm  LEFT ATRIUM           Index        RIGHT ATRIUM           Index LA diam:      3.10 cm 1.64 cm/m  RA Area:     12.70 cm LA Vol (A2C): 45.8 ml 24.16 ml/m  RA Volume:   26.00 ml  13.71 ml/m LA Vol (A4C): 20.7 ml 10.92 ml/m AORTIC VALVE                    PULMONIC VALVE AV Area (Vmax):    3.93 cm     PV Vmax:          0.98 m/s AV Area (Vmean):   3.71 cm     PV Peak grad:     3.8 mmHg AV Area (VTI):     4.08 cm     PR End Diast Vel: 4.67 msec AV Vmax:           101.27 cm/s AV Vmean:          69.833 cm/s AV VTI:            0.164 m AV Peak Grad:      4.1 mmHg AV Mean Grad:      2.0 mmHg LVOT Vmax:         75.00 cm/s LVOT Vmean:        48.800 cm/s LVOT VTI:          0.126 m LVOT/AV VTI ratio: 0.77  AORTA Ao Root diam: 3.40 cm Ao Asc diam:  3.62 cm Ao Arch diam: 3.4 cm  MITRAL VALVE               TRICUSPID VALVE MV Area (PHT): 2.07 cm    TR Peak grad:   18.8 mmHg MV Decel Time: 367 msec    TR Vmax:        217.00 cm/s MV E velocity: 46.30 cm/s MV A velocity: 98.50 cm/s  SHUNTS MV E/A ratio:  0.47        Systemic VTI:  0.13 m Systemic Diam: 2.60 cm  Powell Sorrow MD Electronically signed by Powell Sorrow MD Signature Date/Time: 01/30/2021/2:32:43 PM    Final             Risk Assessment/Calculations:             Physical Exam:   VS:  BP 118/80   Pulse 76   Ht 5' 7.5 (1.715 m)   Wt 175 lb 12.8 oz (79.7 kg)   BMI 27.13 kg/m    Wt Readings from Last 3 Encounters:  03/01/23 175 lb 12.8 oz (79.7 kg)  12/16/22 171 lb 12.8 oz (77.9 kg)  06/16/22 174 lb 6.4 oz (79.1 kg)    GEN: Well nourished, well developed in no acute distress NECK: No JVD; No carotid bruits CARDIAC: RRR, no murmurs, rubs, gallops RESPIRATORY:  Clear to auscultation without rales, wheezing or rhonchi  ABDOMEN: Soft, non-tender, non-distended EXTREMITIES:  No edema; No deformity   ASSESSMENT AND  PLAN: .    HTN / Orthostatic hypotension - BP well controlled. Continue current antihypertensive regimen Lisinopril  5mg  daily. 11/2022 creatinine 1.09, GFR 75. Refills provided. No recent orthostatic symptoms.    HLD - Continue Rosuvastatin  20mg  daily. 11/2022 AST 26, ALT 47. 05/2022 LDL 75.  Refills provided.   Health maintenance - Recommend aiming for 150 minutes of moderate intensity activity per week and following a heart healthy diet.         Dispo: follow up in 1 year with Reche GORMAN Finder, NP (prefers [facility] due to location)  Signed, Reche GORMAN Finder, NP

## 2023-03-08 ENCOUNTER — Ambulatory Visit (INDEPENDENT_AMBULATORY_CARE_PROVIDER_SITE_OTHER): Payer: Medicare Other | Admitting: Nurse Practitioner

## 2023-03-08 ENCOUNTER — Encounter: Payer: Self-pay | Admitting: Nurse Practitioner

## 2023-03-08 VITALS — BP 120/80 | HR 64 | Temp 98.4°F | Ht 67.0 in | Wt 178.6 lb

## 2023-03-08 DIAGNOSIS — Z2821 Immunization not carried out because of patient refusal: Secondary | ICD-10-CM

## 2023-03-08 DIAGNOSIS — R0981 Nasal congestion: Secondary | ICD-10-CM | POA: Diagnosis not present

## 2023-03-08 DIAGNOSIS — R0982 Postnasal drip: Secondary | ICD-10-CM

## 2023-03-08 NOTE — Assessment & Plan Note (Signed)
 Continue current over the counter medications. I have also given him samples of Norel AD

## 2023-03-08 NOTE — Assessment & Plan Note (Signed)

## 2023-03-08 NOTE — Assessment & Plan Note (Signed)
 If he does not get better will send a short course of prednisone.

## 2023-03-08 NOTE — Progress Notes (Signed)
 LILLETTE Kristeen JINNY Gladis, CMA,acting as a neurosurgeon for [name], FNP.,have documented all relevant documentation on the behalf of [name], FNP,as directed by  [name], FNP while in the presence of [name], FNP.  Subjective:  Patient ID: Rodney Thompson , male    DOB: [birth year] , 68 y.o.   MRN: [id number]  Chief Complaint  Patient presents with   Nasal Congestion    HPI  Patient presents today for head congestion, post nasal drip, chest congestion for about 3 weeks. Patient reports he went to urgent care on Saturday and tested negative for covid. He was having flu-like symptoms at the beginning. It started to get better. He has a post nasal drip, he does sleep well at night then in the morning it starts again. He is not using a nasal spray. He no longer has a fever only in the first 24 hours.  Patient reports he wasn't treated with anything at urgent care. Patient reports he has tried OTC medications.      Past Medical History:  Diagnosis Date   Hyperlipemia    Hypertension    Plantar fasciitis, bilateral 1978     Family History  Problem Relation Age of Onset   Hypertension Mother 60   Cancer Father 75   CVA Paternal Grandfather    Hypertension Sister    Hypertension Sister      Current Outpatient Medications:    fish oil-omega-3 fatty acids 1000 MG capsule, Take 1 g by mouth daily., Disp: , Rfl:    lisinopril  (ZESTRIL ) 5 MG tablet, TAKE 1 TABLET(5 MG) BY MOUTH DAILY, Disp: 90 tablet, Rfl: 3   rosuvastatin  (CRESTOR ) 20 MG tablet, Take 1 tablet (20 mg total) by mouth daily., Disp: 90 tablet, Rfl: 3   Allergies  Allergen Reactions   Simcor [Niacin-Simvastatin  Er] Other (See Comments)    flushing   Vicodin [Hydrocodone-Acetaminophen ] Itching and Rash     Review of Systems  Constitutional: Negative.   Respiratory: Negative.    Cardiovascular: Negative.   Neurological: Negative.   Psychiatric/Behavioral: Negative.       Today's Vitals   03/08/23 0858  BP: 120/80   Pulse: 64  Temp: 98.4 F (36.9 C)  TempSrc: Oral  SpO2: 98%  Weight: 178 lb 9.6 oz (81 kg)  Height: 5' 7 (1.702 m)  PainSc: 0-No pain   Body mass index is 27.97 kg/m.  Wt Readings from Last 3 Encounters:  03/08/23 178 lb 9.6 oz (81 kg)  03/01/23 175 lb 12.8 oz (79.7 kg)  12/16/22 171 lb 12.8 oz (77.9 kg)     Objective:  Physical Exam Vitals and nursing note reviewed.  Constitutional:      General: He is not in acute distress.    Appearance: Normal appearance.  HENT:     Head: Normocephalic and atraumatic.     Right Ear: Tympanic membrane, ear canal and external ear normal. There is no impacted cerumen.     Left Ear: Tympanic membrane, ear canal and external ear normal. There is no impacted cerumen.     Nose: Nose normal.     Mouth/Throat:     Mouth: Mucous membranes are moist.  Eyes:     Extraocular Movements: Extraocular movements intact.  Neck:     Comments: Submandibular lymphadenopathy, nontender Cardiovascular:     Rate and Rhythm: Normal rate and regular rhythm.  Pulmonary:     Effort: Pulmonary effort is normal. No respiratory distress.     Breath sounds: Normal breath sounds.  No wheezing.  Genitourinary:    Comments: deferred Musculoskeletal:        General: Normal range of motion.     Cervical back: Normal range of motion and neck supple. No tenderness.  Lymphadenopathy:     Cervical: No cervical adenopathy.  Skin:    General: Skin is warm and dry.  Neurological:     General: No focal deficit present.     Mental Status: He is alert and oriented to person, place, and time.     Cranial Nerves: No cranial nerve deficit.     Motor: No weakness.  Psychiatric:        Mood and Affect: Mood normal.        Behavior: Behavior normal.         Assessment And Plan:  Head congestion Assessment & Plan: If he does not get better will send a short course of prednisone.    Post-nasal drip Assessment & Plan: Continue current over the counter medications. I  have also given him samples of Norel AD   COVID-19 vaccination declined Assessment & Plan: Declines covid 19 vaccine. Discussed risk of covid 44 and if he changes her mind about the vaccine to call the office. Education has been provided regarding the importance of this vaccine but patient still declined. Advised may receive this vaccine at local pharmacy or Health Dept.or vaccine clinic. Aware to provide a copy of the vaccination record if obtained from local pharmacy or Health Dept.  Encouraged to take multivitamin, vitamin d , vitamin c and zinc to increase immune system. Aware can call office if would like to have vaccine here at office. Verbalized acceptance and understanding.     Return for keep same next. .  Patient was given opportunity to ask questions. Patient verbalized understanding of the plan and was able to repeat key elements of the plan. All questions were answered to their satisfaction.    LILLETTE [name], FNP, have reviewed all documentation for this visit. The documentation on 03/08/23 for the exam, diagnosis, procedures, and orders are all accurate and complete.   IF YOU HAVE BEEN REFERRED TO A SPECIALIST, IT MAY TAKE 1-2 WEEKS TO SCHEDULE/PROCESS THE REFERRAL. IF YOU HAVE NOT HEARD FROM US /SPECIALIST IN TWO WEEKS, PLEASE GIVE US  A CALL AT [phone].

## 2023-03-10 ENCOUNTER — Other Ambulatory Visit: Payer: Self-pay | Admitting: Nurse Practitioner

## 2023-03-10 MED ORDER — AMOXICILLIN-POT CLAVULANATE 875-125 MG PO TABS: 1.0000 | ORAL_TABLET | Freq: Two times a day (BID) | ORAL | 0 refills | Status: DC

## 2023-05-15 ENCOUNTER — Encounter: Payer: Self-pay | Admitting: Internal Medicine

## 2023-06-21 ENCOUNTER — Ambulatory Visit: Payer: Medicare Other | Admitting: Internal Medicine

## 2023-06-21 ENCOUNTER — Encounter: Payer: Self-pay | Admitting: Internal Medicine

## 2023-06-21 VITALS — BP 130/80 | HR 63 | Temp 97.5°F | Ht 67.0 in | Wt 176.8 lb

## 2023-06-21 DIAGNOSIS — Z9229 Personal history of other drug therapy: Secondary | ICD-10-CM

## 2023-06-21 DIAGNOSIS — Z0000 Encounter for general adult medical examination without abnormal findings: Secondary | ICD-10-CM

## 2023-06-21 DIAGNOSIS — Z2989 Encounter for other specified prophylactic measures: Secondary | ICD-10-CM

## 2023-06-21 DIAGNOSIS — Z7184 Encounter for health counseling related to travel: Secondary | ICD-10-CM

## 2023-06-21 DIAGNOSIS — I10 Essential (primary) hypertension: Secondary | ICD-10-CM | POA: Diagnosis not present

## 2023-06-21 DIAGNOSIS — E7800 Pure hypercholesterolemia, unspecified: Secondary | ICD-10-CM

## 2023-06-21 DIAGNOSIS — Z23 Encounter for immunization: Secondary | ICD-10-CM

## 2023-06-21 LAB — POCT URINALYSIS DIPSTICK
Bilirubin, UA: NEGATIVE
Blood, UA: NEGATIVE
Glucose, UA: NEGATIVE
Ketones, UA: NEGATIVE
Leukocytes, UA: NEGATIVE
Nitrite, UA: NEGATIVE
Protein, UA: NEGATIVE
Spec Grav, UA: 1.015 (ref 1.010–1.025)
Urobilinogen, UA: 0.2 U/dL
pH, UA: 6.5 (ref 5.0–8.0)

## 2023-06-21 MED ORDER — MEFLOQUINE HCL 250 MG PO TABS: 250.0000 mg | ORAL_TABLET | ORAL | 0 refills | Status: DC

## 2023-06-21 NOTE — Patient Instructions (Signed)
 Health Maintenance, Male  Adopting a healthy lifestyle and getting preventive care are important in promoting health and wellness. Ask your health care provider about:  The right schedule for you to have regular tests and exams.  Things you can do on your own to prevent diseases and keep yourself healthy.  What should I know about diet, weight, and exercise?  Eat a healthy diet    Eat a diet that includes plenty of vegetables, fruits, low-fat dairy products, and lean protein.  Do not eat a lot of foods that are high in solid fats, added sugars, or sodium.  Maintain a healthy weight  Body mass index (BMI) is a measurement that can be used to identify possible weight problems. It estimates body fat based on height and weight. Your health care provider can help determine your BMI and help you achieve or maintain a healthy weight.  Get regular exercise  Get regular exercise. This is one of the most important things you can do for your health. Most adults should:  Exercise for at least 150 minutes each week. The exercise should increase your heart rate and make you sweat (moderate-intensity exercise).  Do strengthening exercises at least twice a week. This is in addition to the moderate-intensity exercise.  Spend less time sitting. Even light physical activity can be beneficial.  Watch cholesterol and blood lipids  Have your blood tested for lipids and cholesterol at 67 years of age, then have this test every 5 years.  You may need to have your cholesterol levels checked more often if:  Your lipid or cholesterol levels are high.  You are older than 67 years of age.  You are at high risk for heart disease.  What should I know about cancer screening?  Many types of cancers can be detected early and may often be prevented. Depending on your health history and family history, you may need to have cancer screening at various ages. This may include screening for:  Colorectal cancer.  Prostate cancer.  Skin cancer.  Lung  cancer.  What should I know about heart disease, diabetes, and high blood pressure?  Blood pressure and heart disease  High blood pressure causes heart disease and increases the risk of stroke. This is more likely to develop in people who have high blood pressure readings or are overweight.  Talk with your health care provider about your target blood pressure readings.  Have your blood pressure checked:  Every 3-5 years if you are 9-95 years of age.  Every year if you are 85 years old or older.  If you are between the ages of 29 and 29 and are a current or former smoker, ask your health care provider if you should have a one-time screening for abdominal aortic aneurysm (AAA).  Diabetes  Have regular diabetes screenings. This checks your fasting blood sugar level. Have the screening done:  Once every three years after age 23 if you are at a normal weight and have a low risk for diabetes.  More often and at a younger age if you are overweight or have a high risk for diabetes.  What should I know about preventing infection?  Hepatitis B  If you have a higher risk for hepatitis B, you should be screened for this virus. Talk with your health care provider to find out if you are at risk for hepatitis B infection.  Hepatitis C  Blood testing is recommended for:  Everyone born from [birth year] through 1964.  Anyone  with known risk factors for hepatitis C.  Sexually transmitted infections (STIs)  You should be screened each year for STIs, including gonorrhea and chlamydia, if:  You are sexually active and are younger than 67 years of age.  You are older than 67 years of age and your health care provider tells you that you are at risk for this type of infection.  Your sexual activity has changed since you were last screened, and you are at increased risk for chlamydia or gonorrhea. Ask your health care provider if you are at risk.  Ask your health care provider about whether you are at high risk for HIV. Your health care provider  may recommend a prescription medicine to help prevent HIV infection. If you choose to take medicine to prevent HIV, you should first get tested for HIV. You should then be tested every 3 months for as long as you are taking the medicine.  Follow these instructions at home:  Alcohol use  Do not drink alcohol if your health care provider tells you not to drink.  If you drink alcohol:  Limit how much you have to 0-2 drinks a day.  Know how much alcohol is in your drink. In the [place] one drink equals one 12 oz bottle of beer (355 mL), one 5 oz glass of wine (148 mL), or one 1 oz glass of hard liquor (44 mL).  Lifestyle  Do not use any products that contain nicotine or tobacco. These products include cigarettes, chewing tobacco, and vaping devices, such as e-cigarettes. If you need help quitting, ask your health care provider.  Do not use street drugs.  Do not share needles.  Ask your health care provider for help if you need support or information about quitting drugs.  General instructions  Schedule regular health, dental, and eye exams.  Stay current with your vaccines.  Tell your health care provider if:  You often feel depressed.  You have ever been abused or do not feel safe at home.  Summary  Adopting a healthy lifestyle and getting preventive care are important in promoting health and wellness.  Follow your health care provider's instructions about healthy diet, exercising, and getting tested or screened for diseases.  Follow your health care provider's instructions on monitoring your cholesterol and blood pressure.  This information is not intended to replace advice given to you by your health care provider. Make sure you discuss any questions you have with your health care provider.  Document Revised: 06/30/2020 Document Reviewed: 06/30/2020  Elsevier Patient Education  2024 [employer].

## 2023-06-21 NOTE — Progress Notes (Signed)
 I,Victoria T Basil Lim, CMA,acting as a Neurosurgeon for Smiley Dung, MD.,have documented all relevant documentation on the behalf of Smiley Dung, MD,as directed by  Smiley Dung, MD while in the presence of Smiley Dung, MD.  Subjective:   Patient ID: Rodney Thompson , male    DOB: 07/25/1955 , 67 y.o.   MRN: [id number]  Chief Complaint  Patient presents with   Annual Exam    Patient presents today for annual exam. He reports compliance with medications. Denies headache, chest pain & sob. He completes prostate exams with urology.  While here today he would like to get polio, hep A.  He also would like antimalaria pill, due to traveling to [place]. Along with a general antibiotic in case they run into an issue of a type of infection.    Hypertension   Hyperlipidemia    HPI Discussed the use of AI scribe software for clinical note transcription with the patient, who gave verbal consent to proceed.  History of Present Illness Rodney Thompson is a 68 year old male with hypertension who presents for a physical and blood pressure check.  He is currently taking lisinopril  5 mg daily for hypertension and rosuvastatin  20 mg daily for hyperlipidemia. He recently visited Cardiology in January and received a refill for his medications. No new symptoms or concerns related to hypertension or hyperlipidemia are reported, and he feels well overall.  He is planning a trip to [place] in June and is inquiring about necessary vaccinations and medications. He is unsure about his tetanus and hepatitis A vaccination status. He wants to check his MMR titers due to recent outbreaks and requests a prescription for malaria prophylaxis for the trip.  He mentions a past biopsy performed by Dr. Martina Sledge at [facility] for a lipoma, which was confirmed to be benign. He had a Mohs procedure done previously, and everything was fine. He had an appointment scheduled with Dr. Martina Sledge, but it was delayed, and he left without  being seen.   Hypertension This is a chronic problem. The current episode started more than 1 year ago. The problem has been gradually improving since onset. The problem is controlled. Pertinent negatives include no blurred vision, chest pain, palpitations or shortness of breath. Risk factors for coronary artery disease include dyslipidemia and male gender. Past treatments include ACE inhibitors. The current treatment provides moderate improvement.     Past Medical History:  Diagnosis Date   Hyperlipemia    Hypertension    Plantar fasciitis, bilateral 1978     Family History  Problem Relation Age of Onset   Hypertension Mother 7   Cancer Father 35   CVA Paternal Grandfather    Hypertension Sister    Hypertension Sister      Current Outpatient Medications:    fish oil-omega-3 fatty acids 1000 MG capsule, Take 1 g by mouth daily., Disp: , Rfl:    lisinopril  (ZESTRIL ) 5 MG tablet, TAKE 1 TABLET(5 MG) BY MOUTH DAILY, Disp: 90 tablet, Rfl: 3   mefloquine  (LARIAM ) 250 MG tablet, Take 1 tablet (250 mg total) by mouth every 7 (seven) days., Disp: 8 tablet, Rfl: 0   rosuvastatin  (CRESTOR ) 20 MG tablet, Take 1 tablet (20 mg total) by mouth daily., Disp: 90 tablet, Rfl: 3   Allergies  Allergen Reactions   Simcor [Niacin-Simvastatin  Er] Other (See Comments)    flushing   Vicodin [Hydrocodone-Acetaminophen ] Itching and Rash     Men's preventive visit. 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 Patient is on a heart healthy diet. Marital status: Married. Relevant history for alcohol use is:  Social History   Substance and Sexual Activity  Alcohol Use Yes   Alcohol/week: 14.0 standard drinks of alcohol   Types: 14 Standard drinks or equivalent per week  . Relevant history for tobacco use is:  Social History   Tobacco Use  Smoking Status Never  Smokeless Tobacco  Never  .   Review of Systems  Constitutional: Negative.   HENT: Negative.    Eyes: Negative.  Negative for blurred vision.  Respiratory: Negative.  Negative for shortness of breath.   Cardiovascular: Negative.  Negative for chest pain and palpitations.  Gastrointestinal: Negative.   Endocrine: Negative.   Genitourinary: Negative.   Musculoskeletal: Negative.   Skin: Negative.   Allergic/Immunologic: Negative.   Neurological: Negative.   Hematological: Negative.   Psychiatric/Behavioral: Negative.       Today's Vitals   06/21/23 0839  BP: 130/80  Pulse: 63  Temp: (!) 97.5 F (36.4 C)  SpO2: 98%  Weight: 176 lb 12.8 oz (80.2 kg)  Height: 5\' 7"  (1.702 m)   Body mass index is 27.69 kg/m.  Wt Readings from Last 3 Encounters:  06/21/23 176 lb 12.8 oz (80.2 kg)  03/08/23 178 lb 9.6 oz (81 kg)  03/01/23 175 lb 12.8 oz (79.7 kg)    Objective:  Physical Exam Vitals and nursing note reviewed.  Constitutional:      Appearance: Normal appearance.  HENT:     Head: Normocephalic and atraumatic.     Right Ear: Tympanic membrane, ear canal and external ear normal. There is no impacted cerumen.     Left Ear: Tympanic membrane, ear canal and external ear normal. There is no impacted cerumen.     Mouth/Throat:     Pharynx: No oropharyngeal exudate or posterior oropharyngeal erythema.  Eyes:     Extraocular Movements: Extraocular movements intact.  Cardiovascular:     Rate and Rhythm: Normal rate and regular rhythm.     Heart sounds: Normal heart sounds.  Pulmonary:     Effort: Pulmonary effort is normal.     Breath sounds: Normal breath sounds.  Abdominal:     General: Bowel sounds are normal.     Palpations: Abdomen is soft.  Genitourinary:    Comments: Deferred  Musculoskeletal:        General: No swelling or tenderness.     Cervical back: Normal range of motion.  Skin:    General: Skin is warm.  Neurological:     General: No focal deficit present.     Mental  Status: He is alert.  Psychiatric:        Mood and Affect: Mood normal.         Assessment And Plan:    Encounter for annual health examination Assessment & Plan: A full exam was performed.  DRE performed, stool is heme negative.  He is advised to get 30-45 minutes of regular exercise, no less than four to five days per week. Both weight-bearing and aerobic exercises are recommended.  He is advised to follow a healthy diet with at least six fruits/veggies per day, decrease intake of red meat and other saturated fats and to increase fish intake to twice weekly.  Meats/fish should not be fried -- baked, boiled or broiled is preferable. It is also important to cut  back on your sugar intake.  Be sure to read labels - try to avoid anything with added sugar, high fructose corn syrup or other sweeteners.  If you must use a sweetener, you can try stevia or monkfruit.  It is also important to avoid artificially sweetened foods/beverages and diet drinks. Lastly, wear SPF 50 sunscreen on exposed skin and when in direct sunlight for an extended period of time.  Be sure to avoid fast food restaurants and aim for at least 60 ounces of water daily.       Essential hypertension, benign Assessment & Plan: Chronic, controlled with lisinopril  5mg  daily. EKG performed, NSR w/o acute changes. He is encouraged to follow low sodium diet. He will rto in six months for full physical exam.   Orders: -     POCT urinalysis dipstick -     Microalbumin / creatinine urine ratio -     EKG 12-Lead -     CMP14+EGFR -     Lipid panel -     CBC  Pure hypercholesterolemia Assessment & Plan: Chronic, he will continue with rosuvastatin  20mg  daily for now. He is encouraged to follow heart healthy lifestyle. Cardiology input is appreciated.    History of MMR vaccination -     Measles/Mumps/Rubella Immunity  Need for malaria prophylaxis -     Mefloquine  HCl; Take 1 tablet (250 mg total) by mouth every 7 (seven) days.   Dispense: 8 tablet; Refill: 0  Counseling about travel Assessment & Plan: Routine wellness visit for travel preparation to [place]. Discussed necessary vaccinations and preventive measures. - Check MMR titer for measles immunity. - Determine Tdap vaccine availability. - Advise Hepatitis A vaccine from health department or [facility]. - Prescribe malaria prophylaxis: start one week before travel, continue weekly during travel, and for four weeks post-travel.   Immunization due -     Tdap vaccine greater than or equal to 7yo IM   Return for 1 year HM, 6 MONTH CHOL F/U.Aaron Aas Patient was given opportunity to ask questions. Patient verbalized understanding of the plan and was able to repeat key elements of the plan. All questions were answered to their satisfaction.   I, Smiley Dung, MD, have reviewed all documentation for this visit. The documentation on 06/21/23 for the exam, diagnosis, procedures, and orders are all accurate and complete.

## 2023-06-21 NOTE — Assessment & Plan Note (Signed)
 A full exam was performed.  DRE performed, stool is heme negative.  He is advised to get 30-45 minutes of regular exercise, no less than four to five days per week. Both weight-bearing and aerobic exercises are recommended.  He is advised to follow a healthy diet with at least six fruits/veggies per day, decrease intake of red meat and other saturated fats and to increase fish intake to twice weekly.  Meats/fish should not be fried -- baked, boiled or broiled is preferable. It is also important to cut back on your sugar intake.  Be sure to read labels - try to avoid anything with added sugar, high fructose corn syrup or other sweeteners.  If you must use a sweetener, you can try stevia or monkfruit.  It is also important to avoid artificially sweetened foods/beverages and diet drinks. Lastly, wear SPF 50 sunscreen on exposed skin and when in direct sunlight for an extended period of time.  Be sure to avoid fast food restaurants and aim for at least 60 ounces of water daily.

## 2023-06-22 ENCOUNTER — Other Ambulatory Visit (HOSPITAL_BASED_OUTPATIENT_CLINIC_OR_DEPARTMENT_OTHER): Payer: Self-pay

## 2023-06-22 LAB — LIPID PANEL
Chol/HDL Ratio: 3.4 ratio (ref 0.0–5.0)
Cholesterol, Total: 196 mg/dL (ref 100–199)
HDL: 58 mg/dL (ref >39)
LDL Chol Calc (NIH): 117 mg/dL — ABNORMAL HIGH (ref 0–99)
Triglycerides: 116 mg/dL (ref 0–149)
VLDL Cholesterol Cal: 21 mg/dL (ref 5–40)

## 2023-06-22 LAB — CBC
Hematocrit: 48.1 % (ref 37.5–51.0)
Hemoglobin: 16 g/dL (ref 13.0–17.7)
MCH: 29.2 pg (ref 26.6–33.0)
MCHC: 33.3 g/dL (ref 31.5–35.7)
MCV: 88 fL (ref 79–97)
Platelets: 171 10*3/uL (ref 150–450)
RBC: 5.48 x10E6/uL (ref 4.14–5.80)
RDW: 13.5 % (ref 11.6–15.4)
WBC: 5.3 10*3/uL (ref 3.4–10.8)

## 2023-06-22 LAB — MICROALBUMIN / CREATININE URINE RATIO
Creatinine, Urine: 51.3 mg/dL
Microalb/Creat Ratio: 6 mg/g{creat} (ref 0–29)
Microalbumin, Urine: 3.2 ug/mL

## 2023-06-22 LAB — MEASLES/MUMPS/RUBELLA IMMUNITY
MUMPS ABS, IGG: 278 [AU]/ml (ref >10.9)
RUBEOLA AB, IGG: >300 [AU]/ml (ref >16.4)
Rubella Antibodies, IGG: >33 {index} (ref >0.99)

## 2023-06-22 LAB — CMP14+EGFR
ALT: 55 IU/L — ABNORMAL HIGH (ref 0–44)
AST: 35 IU/L (ref 0–40)
Albumin: 4.6 g/dL (ref 3.9–4.9)
Alkaline Phosphatase: 111 IU/L (ref 44–121)
BUN/Creatinine Ratio: 17 (ref 10–24)
BUN: 16 mg/dL (ref 8–27)
Bilirubin Total: 1.3 mg/dL — ABNORMAL HIGH (ref 0.0–1.2)
CO2: 23 mmol/L (ref 20–29)
Calcium: 9.5 mg/dL (ref 8.6–10.2)
Chloride: 102 mmol/L (ref 96–106)
Creatinine, Ser: 0.95 mg/dL (ref 0.76–1.27)
Globulin, Total: 1.9 g/dL (ref 1.5–4.5)
Glucose: 89 mg/dL (ref 70–99)
Potassium: 4.7 mmol/L (ref 3.5–5.2)
Sodium: 140 mmol/L (ref 134–144)
Total Protein: 6.5 g/dL (ref 6.0–8.5)
eGFR: 88 mL/min/{1.73_m2} (ref >59)

## 2023-06-22 MED ORDER — HAVRIX 1440 EL U/ML IM SUSP
1.0000 mL | Freq: Once | INTRAMUSCULAR | 0 refills | Status: AC
  Filled 2023-06-24: qty 1, 1d supply, fill #0

## 2023-06-24 ENCOUNTER — Other Ambulatory Visit (HOSPITAL_BASED_OUTPATIENT_CLINIC_OR_DEPARTMENT_OTHER): Payer: Self-pay

## 2023-06-26 DIAGNOSIS — Z7184 Encounter for health counseling related to travel: Secondary | ICD-10-CM | POA: Insufficient documentation

## 2023-06-26 NOTE — Assessment & Plan Note (Signed)
 Chronic, he will continue with rosuvastatin  20mg  daily for now. He is encouraged to follow heart healthy lifestyle. Cardiology input is appreciated.

## 2023-06-26 NOTE — Assessment & Plan Note (Addendum)
 Chronic, controlled with lisinopril  5mg  daily. EKG performed, NSR w/o acute changes. He is encouraged to follow low sodium diet. He will rto in six months for full physical exam.

## 2023-06-26 NOTE — Assessment & Plan Note (Signed)
 Routine wellness visit for travel preparation to [place]. Discussed necessary vaccinations and preventive measures. - Check MMR titer for measles immunity. - Determine Tdap vaccine availability. - Advise Hepatitis A vaccine from health department or [facility]. - Prescribe malaria prophylaxis: start one week before travel, continue weekly during travel, and for four weeks post-travel.

## 2023-07-12 ENCOUNTER — Telehealth: Payer: Self-pay

## 2023-07-12 ENCOUNTER — Other Ambulatory Visit (HOSPITAL_BASED_OUTPATIENT_CLINIC_OR_DEPARTMENT_OTHER): Payer: Self-pay

## 2023-07-12 MED ORDER — ATOVAQUONE-PROGUANIL HCL 250-100 MG PO TABS
1.0000 | ORAL_TABLET | Freq: Every day | ORAL | 0 refills | Status: DC
Start: 2023-07-12 — End: 2024-01-21
  Filled 2023-07-12: qty 31, 31d supply, fill #0

## 2023-07-12 NOTE — Telephone Encounter (Signed)
 Patient's wife advised that his prescription for malaria pills has been called in for him. she was advised that he would start taking the medication on 07/26/23 once daily until 08/26/23. Patient expressed understanding. YL,RMA

## 2023-07-20 ENCOUNTER — Other Ambulatory Visit (HOSPITAL_BASED_OUTPATIENT_CLINIC_OR_DEPARTMENT_OTHER): Payer: Self-pay

## 2023-07-20 MED ORDER — COMIRNATY 30 MCG/0.3ML IM SUSY
0.3000 mL | PREFILLED_SYRINGE | Freq: Once | INTRAMUSCULAR | 0 refills | Status: AC
  Filled 2023-07-20: qty 0.3, 1d supply, fill #0

## 2023-10-12 ENCOUNTER — Ambulatory Visit (INDEPENDENT_AMBULATORY_CARE_PROVIDER_SITE_OTHER)

## 2023-10-12 DIAGNOSIS — Z0000 Encounter for general adult medical examination without abnormal findings: Secondary | ICD-10-CM | POA: Diagnosis not present

## 2023-10-12 NOTE — Progress Notes (Signed)
 Subjective:   Rodney Thompson is a 68 y.o. who presents for a Medicare Wellness preventive visit.  As a reminder, Annual Wellness Visits don't include a physical exam, and some assessments may be limited, especially if this visit is performed virtually. We may recommend an in-person follow-up visit with your provider if needed.  Visit Complete: Virtual I connected with  Rodney Thompson on 10/12/23 by a audio enabled telemedicine application and verified that I am speaking with the correct person using two identifiers.  Patient Location: Home  Provider Location: Office/Clinic  I discussed the limitations of evaluation and management by telemedicine. The patient expressed understanding and agreed to proceed.  Vital Signs: Because this visit was a virtual/telehealth visit, some criteria may be missing or patient reported. Any vitals not documented were not able to be obtained and vitals that have been documented are patient reported.  VideoError- Librarian, academic were attempted between this provider and patient, however failed, due to patient having technical difficulties OR patient did not have access to video capability.  We continued and completed visit with audio only.   Persons Participating in Visit: Patient.  AWV Questionnaire: No: Patient Medicare AWV questionnaire was not completed prior to this visit.  Cardiac Risk Factors include: advanced age (>55men, >61 women);hypertension;male gender     Objective:    Today's Vitals   There is no height or weight on file to calculate BMI.     10/12/2023    9:06 AM 06/16/2022    9:35 AM 12/14/2012    9:43 AM  Advanced Directives  Does Patient Have a Medical Advance Directive? Yes Yes Patient has advance directive, copy not in chart   Type of Advance Directive Healthcare Power of [place];Living will Living will;Healthcare Power of Attorney Living will;Healthcare Power of Attorney   Does patient want to make  changes to medical advance directive?  No - Patient declined   Copy of Healthcare Power of Attorney in Chart? No - copy requested No - copy requested      Data saved with a previous flowsheet row definition    Current Medications (verified) Outpatient Encounter Medications as of 10/12/2023  Medication Sig   fish oil-omega-3 fatty acids 1000 MG capsule Take 1 g by mouth daily.   lisinopril  (ZESTRIL ) 5 MG tablet TAKE 1 TABLET(5 MG) BY MOUTH DAILY   rosuvastatin  (CRESTOR ) 20 MG tablet Take 1 tablet (20 mg total) by mouth daily.   atovaquone -proguanil (MALARONE ) 250-100 MG TABS tablet Take 1 tablet by mouth daily. Start on June 3rd and continue through July 4th. (Patient not taking: Reported on 10/12/2023)   mefloquine  (LARIAM ) 250 MG tablet Take 1 tablet (250 mg total) by mouth every 7 (seven) days.   No facility-administered encounter medications on file as of 10/12/2023.    Allergies (verified) Simcor [niacin-simvastatin  er] and Vicodin [hydrocodone-acetaminophen ]   History: Past Medical History:  Diagnosis Date   Hyperlipemia    Hypertension    Plantar fasciitis, bilateral 1978   Past Surgical History:  Procedure Laterality Date   cataract surgery Bilateral 2019   COLONOSCOPY     HEEL SPUR SURGERY Bilateral 1978   Also 1979 and 1989.  Plantar fasciitis   rotator cuff surgery Right 10/27/2021   rotator cuff surgery Right 03/25/2022   SHOULDER ARTHROSCOPY WITH BICEPSTENOTOMY Left 12/18/2012   Procedure: SHOULDER ARTHROSCOPY, SUBACROMIAL DECOMPRESSION, OPEN BICEPS TENOTOMY;  Surgeon: Eva Elsie Herring, MD;  Location: [facility];  Service: Orthopedics;  Laterality: Left;  SHOULDER SURGERY  2012   right   TONSILLECTOMY     US  ECHOCARDIOGRAPHY  04/28/2011   trace MR and TR; EF =>55%   Family History  Problem Relation Age of Onset   Hypertension Mother 64   Cancer Father 30   CVA Paternal Grandfather    Hypertension Sister    Hypertension Sister     Social History   Socioeconomic History   Marital status: Married    Spouse name: Not on file   Number of children: Not on file   Years of education: Not on file   Highest education level: Not on file  Occupational History   Not on file  Tobacco Use   Smoking status: Never   Smokeless tobacco: Never  Vaping Use   Vaping status: Never Used  Substance and Sexual Activity   Alcohol use: Yes    Alcohol/week: 14.0 standard drinks of alcohol    Types: 14 Standard drinks or equivalent per week   Drug use: Never   Sexual activity: Not on file  Other Topics Concern   Not on file  Social History Narrative   Not on file   Social Drivers of Health   Financial Resource Strain: Low Risk  (10/12/2023)   Overall Financial Resource Strain (CARDIA)    Difficulty of Paying Living Expenses: Not hard at all  Food Insecurity: No Food Insecurity (10/12/2023)   Hunger Vital Sign    Worried About Running Out of Food in the Last Year: Never true    Ran Out of Food in the Last Year: Never true  Transportation Needs: No Transportation Needs (10/12/2023)   PRAPARE - Administrator, Civil Service (Medical): No    Lack of Transportation (Non-Medical): No  Physical Activity: Sufficiently Active (10/12/2023)   Exercise Vital Sign    Days of Exercise per Week: 5 days    Minutes of Exercise per Session: 90 min  Stress: No Stress Concern Present (10/12/2023)   [employer] of Occupational Health - Occupational Stress Questionnaire    Feeling of Stress: Not at all  Social Connections: Socially Integrated (10/12/2023)   Social Connection and Isolation Panel    Frequency of Communication with Friends and Family: More than three times a week    Frequency of Social Gatherings with Friends and Family: More than three times a week    Attends Religious Services: More than 4 times per year    Active Member of [employer] or Organizations: Yes    Attends Engineer, structural: More than 4 times  per year    Marital Status: Married    Tobacco Counseling Counseling given: Not Answered    Clinical Intake:  Pre-visit preparation completed: Yes  Pain : No/denies pain     Nutritional Risks: None Diabetes: No  Lab Results  Component Value Date   HGBA1C 5.5 04/28/2018     How often do you need to have someone help you when you read instructions, pamphlets, or other written materials from your doctor or pharmacy?: 1 - Never  Interpreter Needed?: No  Information entered by :: NAllen LPN   Activities of Daily Living     10/12/2023    9:02 AM  In your present state of health, do you have any difficulty performing the following activities:  Hearing? 0  Vision? 0  Difficulty concentrating or making decisions? 0  Walking or climbing stairs? 0  Dressing or bathing? 0  Doing errands, shopping? 0  Preparing Food and eating ?  N  Using the Toilet? N  In the past six months, have you accidently leaked urine? N  Do you have problems with loss of bowel control? N  Managing your Medications? N  Managing your Finances? N  Housekeeping or managing your Housekeeping? N    Patient Care Team: Jarold Medici, MD as PCP - General (Internal Medicine) Croitoru, Jerel, MD as PCP - Cardiology (Cardiology) Vannie Reche RAMAN, NP as Nurse Practitioner (Cardiology)  I have updated your Care Teams any recent Medical Services you may have received from other providers in the past year.     Assessment:   This is a routine wellness examination for [place].  Hearing/Vision screen Hearing Screening - Comments:: Denies hearing issues Vision Screening - Comments:: Regular eye exams. Glade Pan   Goals Addressed             This Visit's Progress    Patient Stated       10/12/2023, wants to lose 8 pounds       Depression Screen     10/12/2023    9:07 AM 06/21/2023    8:47 AM 06/16/2022    9:01 AM 05/04/2022    9:21 AM 11/23/2021    8:32 AM 05/20/2021    8:50 AM 05/12/2020     9:40 AM  PHQ 2/9 Scores  PHQ - 2 Score 0 0 0 0 0 0 0  PHQ- 9 Score 0 0 0  0      Fall Risk     10/12/2023    9:07 AM 06/21/2023    8:47 AM 12/16/2022    8:28 AM 06/16/2022    9:01 AM 05/04/2022    9:21 AM  Fall Risk   Falls in the past year? 0 0 0 0 0  Number falls in past yr: 0 0 0 0 0  Injury with Fall? 0 0 0 0 0  Risk for fall due to : Medication side effect No Fall Risks No Fall Risks No Fall Risks No Fall Risks  Follow up Falls evaluation completed;Falls prevention discussed Falls evaluation completed Falls evaluation completed Falls evaluation completed Falls evaluation completed    MEDICARE RISK AT HOME:  Medicare Risk at Home Any stairs in or around the home?: Yes If so, are there any without handrails?: No Home free of loose throw rugs in walkways, pet beds, electrical cords, etc?: Yes Adequate lighting in your home to reduce risk of falls?: Yes Life alert?: No Use of a cane, walker or w/c?: No Grab bars in the bathroom?: No Shower chair or bench in shower?: Yes Elevated toilet seat or a handicapped toilet?: No  TIMED UP AND GO:  Was the test performed?  No  Cognitive Function: 6CIT completed        10/12/2023    9:08 AM 06/16/2022    9:10 AM  6CIT Screen  What Year? 0 points 0 points  What month? 0 points 0 points  What time? 0 points 0 points  Count back from 20 0 points 0 points  Months in reverse 0 points 0 points  Repeat phrase 0 points 2 points  Total Score 0 points 2 points    Immunizations Immunization History  Administered Date(s) Administered   Fluad Quad(high Dose 65+) 11/23/2021   Hepatitis A, Adult 06/24/2023   Influenza Inj Mdck Quad Pf 12/07/2017   Influenza,inj,Quad PF,6+ Mos 10/24/2018, 11/12/2019, 11/04/2022   Influenza-Unspecified 10/24/2018, 10/24/2020   PFIZER Comirnaty [place]jos Top)Covid-19 Tri-Sucrose Vaccine 07/11/2020   PFIZER(Purple Top)SARS-COV-2 Vaccination  05/15/2019, 06/05/2019, 12/26/2019, 10/24/2020   Pfizer Covid-19  Vaccine Bivalent Booster 42yrs & up 10/31/2020, 12/07/2021, 11/04/2022   Pfizer(Comirnaty )Fall Seasonal Vaccine 12 years and older 07/20/2023   Pneumococcal Conjugate-13 01/03/2013   Tdap 06/21/2023   Typhoid Live 05/31/2023   Yellow Fever 05/31/2023   Zoster Recombinant(Shingrix) 12/03/2019, 03/03/2020    Screening Tests Health Maintenance  Topic Date Due   INFLUENZA VACCINE  09/23/2023   Pneumococcal Vaccine: 50+ Years (2 of 2 - PCV20 or PCV21) 06/20/2024 (Originally 01/03/2014)   COVID-19 Vaccine (10 - 2024-25 season) 01/20/2024   Medicare Annual Wellness (AWV)  10/11/2024   Colonoscopy  05/28/2031   Hepatitis C Screening  Completed   Zoster Vaccines- Shingrix  Completed   HPV VACCINES  Aged Out   Meningococcal B Vaccine  Aged Out   DTaP/Tdap/Td  Discontinued   Pneumococcal Vaccine  Discontinued    Health Maintenance  Health Maintenance Due  Topic Date Due   INFLUENZA VACCINE  09/23/2023   Health Maintenance Items Addressed: Due for flu vaccine.  Additional Screening:  Vision Screening: Recommended annual ophthalmology exams for early detection of glaucoma and other disorders of the eye. Would you like a referral to an eye doctor? No    Dental Screening: Recommended annual dental exams for proper oral hygiene  Community Resource Referral / Chronic Care Management: CRR required this visit?  No   CCM required this visit?  No   Plan:    I have personally reviewed and noted the following in the patient's chart:   Medical and social history Use of alcohol, tobacco or illicit drugs  Current medications and supplements including opioid prescriptions. Patient is not currently taking opioid prescriptions. Functional ability and status Nutritional status Physical activity Advanced directives List of other physicians Hospitalizations, surgeries, and ER visits in previous 12 months Vitals Screenings to include cognitive, depression, and falls Referrals and  appointments  In addition, I have reviewed and discussed with patient certain preventive protocols, quality metrics, and best practice recommendations. A written personalized care plan for preventive services as well as general preventive health recommendations were provided to patient.   Ardella FORBES Dawn, LPN   [id number]/[id number]   After Visit Summary: (MyChart) Due to this being a telephonic visit, the after visit summary with patients personalized plan was offered to patient via MyChart   Notes: Nothing significant to report at this time.

## 2023-10-12 NOTE — Patient Instructions (Signed)
 Mr. Rodney Thompson , Thank you for taking time out of your busy schedule to complete your Annual Wellness Visit with me. I enjoyed our conversation and look forward to speaking with you again next year. I, as well as your care team,  appreciate your ongoing commitment to your health goals. Please review the following plan we discussed and let me know if I can assist you in the future. Your Game plan/ To Do List    Referrals: If you haven't heard from the office you've been referred to, please reach out to them at the phone provided.   Follow up Visits: We will see or speak with you next year for your Next Medicare AWV with our clinical staff Have you seen your provider in the last 6 months (3 months if uncontrolled diabetes)? Yes  Clinician Recommendations:  Aim for 30 minutes of exercise or brisk walking, 6-8 glasses of water, and 5 servings of fruits and vegetables each day.       This is a list of the screenings recommended for you:  Health Maintenance  Topic Date Due   Flu Shot  09/23/2023   Pneumococcal Vaccine for age over 18 (2 of 2 - PCV20 or PCV21) 06/20/2024*   COVID-19 Vaccine (10 - 2024-25 season) 01/20/2024   Medicare Annual Wellness Visit  10/11/2024   Colon Cancer Screening  05/28/2031   Hepatitis C Screening  Completed   Zoster (Shingles) Vaccine  Completed   HPV Vaccine  Aged Out   Meningitis B Vaccine  Aged Out   DTaP/Tdap/Td vaccine  Discontinued   Pneumococcal Vaccine  Discontinued  *Topic was postponed. The date shown is not the original due date.    Advanced directives: (Copy Requested) Please bring a copy of your health care power of attorney and living will to the office to be added to your chart at your convenience. You can mail to [facility] [address] [place], [place] [address] or email to [email or web address] Advance Care Planning is important because it:  [x]  Makes sure you receive the medical care that is consistent with your  values, goals, and preferences  [x]  It provides guidance to your family and loved ones and reduces their decisional burden about whether or not they are making the right decisions based on your wishes.  Follow the link provided in your after visit summary or read over the paperwork we have mailed to you to help you started getting your Advance Directives in place. If you need assistance in completing these, please reach out to us  so that we can help you!  See attachments for Preventive Care and Fall Prevention Tips.

## 2023-11-02 ENCOUNTER — Ambulatory Visit (INDEPENDENT_AMBULATORY_CARE_PROVIDER_SITE_OTHER): Payer: BLUE CROSS/BLUE SHIELD | Admitting: Otolaryngology

## 2023-11-02 VITALS — BP 106/67 | HR 74

## 2023-11-02 DIAGNOSIS — H903 Sensorineural hearing loss, bilateral: Secondary | ICD-10-CM | POA: Diagnosis not present

## 2023-11-02 DIAGNOSIS — H9393 Unspecified disorder of ear, bilateral: Secondary | ICD-10-CM

## 2023-11-02 NOTE — Progress Notes (Signed)
 Patient ID: Rodney Thompson, male   DOB: [birth year], 68 y.o.   MRN: [id number]  Follow-up: Bilateral hearing loss  HPI: The patient is a 68 year old male who returns today for his follow-up evaluation.  The patient was last seen in August 2024.  At that time, he was noted to have bilateral mild high-frequency sensorineural hearing loss.  The patient returns today reporting no significant change in his hearing.  He still has occasional hearing difficulty, especially in noisy environments.  He denies any otalgia, otorrhea, tinnitus, or vertigo.  Exam: General: Communicates without difficulty, well nourished, no acute distress. Head: Normocephalic, no evidence injury, no tenderness, facial buttresses intact without stepoff. Face/sinus: No tenderness to palpation and percussion. Facial movement is normal and symmetric. Eyes: PERRL, EOMI. No scleral icterus, conjunctivae clear. Neuro: CN II exam reveals vision grossly intact.  No nystagmus at any point of gaze. Ears: Auricles well formed without lesions.  Ear canals are intact with bilateral small osteomas.  No erythema or edema is appreciated.  The TMs are intact without fluid. Nose: External evaluation reveals normal support and skin without lesions.  Dorsum is intact.  Anterior rhinoscopy reveals congested mucosa over anterior aspect of inferior turbinates and intact septum.  No purulence noted. Oral:  Oral cavity and oropharynx are intact, symmetric, without erythema or edema.  Mucosa is moist without lesions. Neck: Full range of motion without pain.  There is no significant lymphadenopathy.  No masses palpable.  Thyroid bed within normal limits to palpation.  Parotid glands and submandibular glands equal bilaterally without mass.  Trachea is midline. Neuro:  CN 2-12 grossly intact.   Assessment: 1.  Subjectively stable bilateral mild high-frequency sensorineural hearing loss. 2.  Bilateral small ear canal osteomas.  Plan: 1.  The physical exam findings are  reviewed with the patient. 2.  The patient is reassured that the osteomas are benign. 3.  Hearing amplification options are discussed. 4.  The patient will return for reevaluation in 1 year, with repeat hearing test.

## 2023-12-13 ENCOUNTER — Ambulatory Visit: Admitting: Internal Medicine

## 2023-12-15 ENCOUNTER — Ambulatory Visit: Admitting: Internal Medicine

## 2023-12-21 ENCOUNTER — Other Ambulatory Visit (HOSPITAL_BASED_OUTPATIENT_CLINIC_OR_DEPARTMENT_OTHER): Payer: Self-pay

## 2023-12-21 MED ORDER — FLUZONE HIGH-DOSE 0.5 ML IM SUSY
0.5000 mL | PREFILLED_SYRINGE | Freq: Once | INTRAMUSCULAR | 0 refills | Status: AC
  Filled 2023-12-21: qty 0.5, 1d supply, fill #0

## 2024-01-11 ENCOUNTER — Ambulatory Visit: Payer: Self-pay | Admitting: Internal Medicine

## 2024-01-11 ENCOUNTER — Encounter: Payer: Self-pay | Admitting: Internal Medicine

## 2024-01-11 ENCOUNTER — Ambulatory Visit (INDEPENDENT_AMBULATORY_CARE_PROVIDER_SITE_OTHER): Payer: Self-pay | Admitting: Internal Medicine

## 2024-01-11 VITALS — BP 118/78 | HR 77 | Temp 98.3°F | Ht 67.0 in | Wt 173.0 lb

## 2024-01-11 DIAGNOSIS — I10 Essential (primary) hypertension: Secondary | ICD-10-CM

## 2024-01-11 DIAGNOSIS — E7800 Pure hypercholesterolemia, unspecified: Secondary | ICD-10-CM

## 2024-01-11 DIAGNOSIS — N401 Enlarged prostate with lower urinary tract symptoms: Secondary | ICD-10-CM

## 2024-01-11 DIAGNOSIS — R350 Frequency of micturition: Secondary | ICD-10-CM

## 2024-01-11 LAB — POCT URINALYSIS DIPSTICK
Bilirubin, UA: NEGATIVE
Blood, UA: NEGATIVE
Glucose, UA: NEGATIVE
Leukocytes, UA: NEGATIVE
Nitrite, UA: NEGATIVE
Protein, UA: NEGATIVE
Spec Grav, UA: >=1.03 — AB (ref 1.010–1.025)
Urobilinogen, UA: 0.2 U/dL
pH, UA: 6 (ref 5.0–8.0)

## 2024-01-11 NOTE — Patient Instructions (Signed)
 Cholesterol Content in Foods Cholesterol is a waxy, fat-like substance that helps to carry fat in the blood. The body needs cholesterol in small amounts, but too much cholesterol can cause damage to the arteries and heart. What foods have cholesterol?  Cholesterol is found in animal-based foods, such as meat, seafood, and dairy. Generally, low-fat dairy and lean meats have less cholesterol than full-fat dairy and fatty meats. The milligrams of cholesterol per serving (mg per serving) of common cholesterol-containing foods are listed below. Meats and other proteins Egg -- one large whole egg has 186 mg. Veal shank -- 4 oz (113 g) has 141 mg. Lean ground [place] (93% lean) -- 4 oz (113 g) has 118 mg. Fat-trimmed lamb loin -- 4 oz (113 g) has 106 mg. Lean ground beef (90% lean) -- 4 oz (113 g) has 100 mg. Lobster -- 3.5 oz (99 g) has 90 mg. Pork loin chops -- 4 oz (113 g) has 86 mg. Canned salmon -- 3.5 oz (99 g) has 83 mg. Fat-trimmed beef top loin -- 4 oz (113 g) has 78 mg. Frankfurter -- 1 frank (3.5 oz or 99 g) has 77 mg. Crab -- 3.5 oz (99 g) has 71 mg. Roasted chicken without skin, white meat -- 4 oz (113 g) has 66 mg. Light bologna -- 2 oz (57 g) has 45 mg. Deli-cut [place] -- 2 oz (57 g) has 31 mg. Canned tuna -- 3.5 oz (99 g) has 31 mg. Tomasa Blase -- 1 oz (28 g) has 29 mg. Oysters and mussels (raw) -- 3.5 oz (99 g) has 25 mg. Mackerel -- 1 oz (28 g) has 22 mg. Trout -- 1 oz (28 g) has 20 mg. Pork sausage -- 1 link (1 oz or 28 g) has 17 mg. Salmon -- 1 oz (28 g) has 16 mg. Tilapia -- 1 oz (28 g) has 14 mg. Dairy Soft-serve ice cream --  cup (4 oz or 86 g) has 103 mg. Whole-milk yogurt -- 1 cup (8 oz or 245 g) has 29 mg. Cheddar cheese -- 1 oz (28 g) has 28 mg. American cheese -- 1 oz (28 g) has 28 mg. Whole milk -- 1 cup (8 oz or 250 mL) has 23 mg. 2% milk -- 1 cup (8 oz or 250 mL) has 18 mg. Cream cheese -- 1 tablespoon (Tbsp) (14.5 g) has 15 mg. Cottage cheese --  cup (4 oz or  113 g) has 14 mg. Low-fat (1%) milk -- 1 cup (8 oz or 250 mL) has 10 mg. Sour cream -- 1 Tbsp (12 g) has 8.5 mg. Low-fat yogurt -- 1 cup (8 oz or 245 g) has 8 mg. Nonfat Greek yogurt -- 1 cup (8 oz or 228 g) has 7 mg. Half-and-half cream -- 1 Tbsp (15 mL) has 5 mg. Fats and oils Cod liver oil -- 1 tablespoon (Tbsp) (13.6 g) has 82 mg. Butter -- 1 Tbsp (14 g) has 15 mg. Lard -- 1 Tbsp (12.8 g) has 14 mg. Bacon grease -- 1 Tbsp (12.9 g) has 14 mg. Mayonnaise -- 1 Tbsp (13.8 g) has 5-10 mg. Margarine -- 1 Tbsp (14 g) has 3-10 mg. The items listed above may not be a complete list of foods with cholesterol. Exact amounts of cholesterol in these foods may vary depending on specific ingredients and brands. Contact a dietitian for more information. What foods do not have cholesterol? Most plant-based foods do not have cholesterol unless you combine them with a food that has  cholesterol. Foods without cholesterol include: Grains and cereals. Vegetables. Fruits. Vegetable oils, such as olive, canola, and sunflower oil. Legumes, such as peas, beans, and lentils. Nuts and seeds. Egg whites. The items listed above may not be a complete list of foods that do not have cholesterol. Contact a dietitian for more information. Summary The body needs cholesterol in small amounts, but too much cholesterol can cause damage to the arteries and heart. Cholesterol is found in animal-based foods, such as meat, seafood, and dairy. Generally, low-fat dairy and lean meats have less cholesterol than full-fat dairy and fatty meats. This information is not intended to replace advice given to you by your health care provider. Make sure you discuss any questions you have with your health care provider. Document Revised: 06/20/2020 Document Reviewed: 06/20/2020 Elsevier Patient Education  2024 [employer].

## 2024-01-11 NOTE — Progress Notes (Signed)
 I,Rodney Thompson, CMA,acting as a neurosurgeon for Rodney LOISE Slocumb, MD.,have documented all relevant documentation on the behalf of Rodney LOISE Slocumb, MD,as directed by  Rodney LOISE Slocumb, MD while in the presence of Rodney LOISE Slocumb, MD.  Subjective:  Patient ID: Rodney Thompson , male    DOB: 11-22-1955 , 67 y.o.   MRN: [id number]  Chief Complaint  Patient presents with   Hyperlipidemia    Patient presents for hypertension & cholesterol follow up. He states that he is compliant with medication. Denies headache, chest pain, and blurred vision. He denies having any specific questions or concerns.    Hypertension    HPI Discussed the use of AI scribe software for clinical note transcription with the patient, who gave verbal consent to proceed.  History of Present Illness He is a 68 year old male who presents for a cholesterol and blood pressure check.  He scheduled an afternoon appointment due to a golf tournament, despite typically preferring morning visits.  He has been fasting for the blood draw, having only consumed two small muffins earlier in the day and a lot of water.  No headache.   Hyperlipidemia This is a chronic problem. The current episode started more than 1 year ago. He has no history of chronic renal disease or obesity. Pertinent negatives include no chest pain. Current antihyperlipidemic treatment includes statins. The current treatment provides moderate improvement of lipids.  Hypertension This is a chronic problem. The current episode started more than 1 year ago. The problem has been gradually improving since onset. The problem is controlled. Pertinent negatives include no blurred vision, chest pain or palpitations. Risk factors for coronary artery disease include dyslipidemia and male gender. Past treatments include ACE inhibitors. The current treatment provides moderate improvement. There is no history of chronic renal disease.     Past Medical History:  Diagnosis Date    Hyperlipemia    Hypertension    Plantar fasciitis, bilateral 1978     Family History  Problem Relation Age of Onset   Hypertension Mother 17   Cancer Father 29   CVA Paternal Grandfather    Hypertension Sister    Hypertension Sister      Current Outpatient Medications:    fish oil-omega-3 fatty acids 1000 MG capsule, Take 1 g by mouth daily., Disp: , Rfl:    lisinopril  (ZESTRIL ) 5 MG tablet, TAKE 1 TABLET(5 MG) BY MOUTH DAILY, Disp: 90 tablet, Rfl: 3   rosuvastatin  (CRESTOR ) 20 MG tablet, Take 1 tablet (20 mg total) by mouth daily., Disp: 90 tablet, Rfl: 3   Allergies  Allergen Reactions   Simcor [Niacin-Simvastatin  Er] Other (See Comments)    flushing   Vicodin [Hydrocodone-Acetaminophen ] Itching and Rash     Review of Systems  Constitutional: Negative.   HENT: Negative.    Eyes:  Negative for blurred vision.  Respiratory: Negative.    Cardiovascular: Negative.  Negative for chest pain and palpitations.  Gastrointestinal: Negative.   Endocrine: Negative.   Skin: Negative.   Allergic/Immunologic: Negative.   Hematological: Negative.      Today's Vitals   01/11/24 1532  BP: 118/78  Pulse: 77  Temp: 98.3 F (36.8 C)  SpO2: 98%  Weight: 173 lb (78.5 kg)  Height: 5' 7 (1.702 m)   Body mass index is 27.1 kg/m.  Wt Readings from Last 3 Encounters:  01/11/24 173 lb (78.5 kg)  06/21/23 176 lb 12.8 oz (80.2 kg)  03/08/23 178 lb 9.6 oz (81 kg)  Objective:  Physical Exam Vitals and nursing note reviewed.  Constitutional:      Appearance: Normal appearance.  HENT:     Head: Normocephalic and atraumatic.  Eyes:     Extraocular Movements: Extraocular movements intact.  Cardiovascular:     Rate and Rhythm: Normal rate and regular rhythm.     Heart sounds: Normal heart sounds.  Pulmonary:     Effort: Pulmonary effort is normal.     Breath sounds: Normal breath sounds.  Musculoskeletal:     Cervical back: Normal range of motion.  Skin:    General: Skin  is warm.  Neurological:     General: No focal deficit present.     Mental Status: He is alert.  Psychiatric:        Mood and Affect: Mood normal.     Assessment And Plan:  Pure hypercholesterolemia Assessment & Plan: Chronic, he will continue with rosuvastatin  20mg  daily for now. He is encouraged to follow heart healthy lifestyle. Cardiology input is appreciated.  - He will rto in six months for a full physical exam.   Orders: -     CMP14+EGFR -     Lipid panel  Essential hypertension, benign Assessment & Plan: Chronic, controlled with lisinopril  5mg  daily. He is encouraged to follow low sodium diet. He will rto in six months for full physical exam.   Orders: -     CMP14+EGFR -     Lipid panel  Benign prostatic hyperplasia with urinary frequency Assessment & Plan: Urology note reviewed.  - Will consider use of Flomax to help with symptoms  Orders: -     POCT urinalysis dipstick    Return for 6 MONTH CHOL F/U.SABRA  Patient was given opportunity to ask questions. Patient verbalized understanding of the plan and was able to repeat key elements of the plan. All questions were answered to their satisfaction.   I, Rodney LOISE Slocumb, MD, have reviewed all documentation for this visit. The documentation on 01/11/24 for the exam, diagnosis, procedures, and orders are all accurate and complete.   IF YOU HAVE BEEN REFERRED TO A SPECIALIST, IT MAY TAKE 1-2 WEEKS TO SCHEDULE/PROCESS THE REFERRAL. IF YOU HAVE NOT HEARD FROM US /SPECIALIST IN TWO WEEKS, PLEASE GIVE US  A CALL AT [phone] X [phone].   THE PATIENT IS ENCOURAGED TO PRACTICE SOCIAL DISTANCING DUE TO THE COVID-19 PANDEMIC.

## 2024-01-12 LAB — CMP14+EGFR
ALT: 52 IU/L — ABNORMAL HIGH (ref 0–44)
AST: 36 IU/L (ref 0–40)
Albumin: 4.7 g/dL (ref 3.9–4.9)
Alkaline Phosphatase: 101 IU/L (ref 47–123)
BUN/Creatinine Ratio: 16 (ref 10–24)
BUN: 15 mg/dL (ref 8–27)
Bilirubin Total: 1.2 mg/dL (ref 0.0–1.2)
CO2: 20 mmol/L (ref 20–29)
Calcium: 9.9 mg/dL (ref 8.6–10.2)
Chloride: 102 mmol/L (ref 96–106)
Creatinine, Ser: 0.94 mg/dL (ref 0.76–1.27)
Globulin, Total: 1.9 g/dL (ref 1.5–4.5)
Glucose: 92 mg/dL (ref 70–99)
Potassium: 4.5 mmol/L (ref 3.5–5.2)
Sodium: 139 mmol/L (ref 134–144)
Total Protein: 6.6 g/dL (ref 6.0–8.5)
eGFR: 89 mL/min/1.73 (ref >59)

## 2024-01-12 LAB — LIPID PANEL
Chol/HDL Ratio: 2.4 ratio (ref 0.0–5.0)
Cholesterol, Total: 157 mg/dL (ref 100–199)
HDL: 66 mg/dL (ref >39)
LDL Chol Calc (NIH): 73 mg/dL (ref 0–99)
Triglycerides: 98 mg/dL (ref 0–149)
VLDL Cholesterol Cal: 18 mg/dL (ref 5–40)

## 2024-01-16 ENCOUNTER — Other Ambulatory Visit: Payer: Self-pay | Admitting: Internal Medicine

## 2024-01-16 DIAGNOSIS — R748 Abnormal levels of other serum enzymes: Secondary | ICD-10-CM

## 2024-01-21 DIAGNOSIS — N401 Enlarged prostate with lower urinary tract symptoms: Secondary | ICD-10-CM | POA: Insufficient documentation

## 2024-01-21 NOTE — Assessment & Plan Note (Signed)
 Urology note reviewed.  - Will consider use of Flomax to help with symptoms

## 2024-01-21 NOTE — Assessment & Plan Note (Signed)
Chronic, controlled with lisinopril 5mg  daily. He is encouraged to follow low sodium diet. He will rto in six months for full physical exam.

## 2024-01-21 NOTE — Assessment & Plan Note (Addendum)
 Chronic, he will continue with rosuvastatin  20mg  daily for now. He is encouraged to follow heart healthy lifestyle. Cardiology input is appreciated.  - He will rto in six months for a full physical exam.

## 2024-02-01 ENCOUNTER — Encounter (HOSPITAL_BASED_OUTPATIENT_CLINIC_OR_DEPARTMENT_OTHER): Payer: Self-pay | Admitting: Family

## 2024-02-26 ENCOUNTER — Other Ambulatory Visit (HOSPITAL_BASED_OUTPATIENT_CLINIC_OR_DEPARTMENT_OTHER): Payer: Self-pay | Admitting: Family

## 2024-02-26 DIAGNOSIS — E782 Mixed hyperlipidemia: Secondary | ICD-10-CM

## 2024-02-26 DIAGNOSIS — I10 Essential (primary) hypertension: Secondary | ICD-10-CM

## 2024-02-28 ENCOUNTER — Other Ambulatory Visit (HOSPITAL_BASED_OUTPATIENT_CLINIC_OR_DEPARTMENT_OTHER): Payer: Self-pay | Admitting: Family

## 2024-02-28 DIAGNOSIS — I10 Essential (primary) hypertension: Secondary | ICD-10-CM

## 2024-02-28 DIAGNOSIS — E782 Mixed hyperlipidemia: Secondary | ICD-10-CM

## 2024-03-06 ENCOUNTER — Other Ambulatory Visit (HOSPITAL_BASED_OUTPATIENT_CLINIC_OR_DEPARTMENT_OTHER): Payer: Self-pay

## 2024-03-06 MED ORDER — COMIRNATY 30 MCG/0.3ML IM SUSY
0.3000 mL | PREFILLED_SYRINGE | Freq: Once | INTRAMUSCULAR | 0 refills | Status: AC
  Filled 2024-03-06: qty 0.3, 1d supply, fill #0

## 2024-07-03 ENCOUNTER — Encounter: Payer: Self-pay | Admitting: Internal Medicine

## 2024-11-07 ENCOUNTER — Ambulatory Visit: Payer: Self-pay
